# Patient Record
Sex: Male | Born: 1955 | Race: Black or African American | Hispanic: No | Smoking: Former smoker
Health system: Southern US, Community
[De-identification: ages and names within clinical notes are randomized; demographics above are authoritative.]

## PROBLEM LIST (undated history)

## (undated) DIAGNOSIS — M1990 Unspecified osteoarthritis, unspecified site: Secondary | ICD-10-CM

## (undated) DIAGNOSIS — E119 Type 2 diabetes mellitus without complications: Secondary | ICD-10-CM

## (undated) DIAGNOSIS — R3911 Hesitancy of micturition: Secondary | ICD-10-CM

## (undated) DIAGNOSIS — E039 Hypothyroidism, unspecified: Secondary | ICD-10-CM

## (undated) DIAGNOSIS — N400 Enlarged prostate without lower urinary tract symptoms: Secondary | ICD-10-CM

## (undated) HISTORY — PX: OTHER SURGICAL HISTORY: SHX169

## (undated) HISTORY — PX: JOINT REPLACEMENT: SHX530

---

## 2008-02-03 ENCOUNTER — Inpatient Hospital Stay (HOSPITAL_COMMUNITY): Admission: RE | Admit: 2008-02-03 | Discharge: 2008-02-07 | Payer: Self-pay | Admitting: Orthopedic Surgery

## 2008-02-16 ENCOUNTER — Emergency Department (HOSPITAL_COMMUNITY): Admission: EM | Admit: 2008-02-16 | Discharge: 2008-02-16 | Payer: Self-pay | Admitting: Emergency Medicine

## 2008-04-11 ENCOUNTER — Encounter (HOSPITAL_COMMUNITY): Admission: RE | Admit: 2008-04-11 | Discharge: 2008-05-11 | Payer: Self-pay | Admitting: Orthopedic Surgery

## 2009-05-11 ENCOUNTER — Observation Stay (HOSPITAL_COMMUNITY): Admission: EM | Admit: 2009-05-11 | Discharge: 2009-05-12 | Payer: Self-pay | Admitting: Emergency Medicine

## 2010-02-06 ENCOUNTER — Inpatient Hospital Stay (HOSPITAL_COMMUNITY): Admission: EM | Admit: 2010-02-06 | Discharge: 2010-02-09 | Payer: Self-pay | Admitting: Emergency Medicine

## 2010-02-06 ENCOUNTER — Encounter: Payer: Self-pay | Admitting: Emergency Medicine

## 2010-06-11 LAB — DIFFERENTIAL
Basophils Relative: 0 % (ref 0–1)
Basophils Relative: 0 % (ref 0–1)
Eosinophils Absolute: 0.1 10*3/uL (ref 0.0–0.7)
Eosinophils Relative: 1 % (ref 0–5)
Eosinophils Relative: 0 % (ref 0–5)
Lymphocytes Relative: 12 % (ref 12–46)
Monocytes Absolute: 1.2 10*3/uL — ABNORMAL HIGH (ref 0.1–1.0)
Monocytes Relative: 10 % (ref 3–12)
Neutro Abs: 10 10*3/uL — ABNORMAL HIGH (ref 1.7–7.7)
Neutrophils Relative %: 81 % — ABNORMAL HIGH (ref 43–77)

## 2010-06-11 LAB — CBC
MCH: 27.4 pg (ref 26.0–34.0)
MCHC: 32.9 g/dL (ref 30.0–36.0)
MCV: 83.2 fL (ref 78.0–100.0)
MCV: 82.7 fL (ref 78.0–100.0)
Platelets: 226 10*3/uL (ref 150–400)
Platelets: 217 10*3/uL (ref 150–400)
RBC: 3.88 MIL/uL — ABNORMAL LOW (ref 4.22–5.81)
RDW: 14.4 % (ref 11.5–15.5)
WBC: 12.8 10*3/uL — ABNORMAL HIGH (ref 4.0–10.5)

## 2010-06-11 LAB — CULTURE, ROUTINE-ABSCESS: Gram Stain: NONE SEEN

## 2010-06-11 LAB — BASIC METABOLIC PANEL
BUN: 14 mg/dL (ref 6–23)
CO2: 26 mEq/L (ref 19–32)
Calcium: 9.3 mg/dL (ref 8.4–10.5)
Chloride: 103 mEq/L (ref 96–112)
Creatinine, Ser: 0.96 mg/dL (ref 0.4–1.5)
Glucose, Bld: 107 mg/dL — ABNORMAL HIGH (ref 70–99)

## 2010-06-11 LAB — COMPREHENSIVE METABOLIC PANEL
AST: 27 U/L (ref 0–37)
Albumin: 2.9 g/dL — ABNORMAL LOW (ref 3.5–5.2)
Alkaline Phosphatase: 47 U/L (ref 39–117)
BUN: 9 mg/dL (ref 6–23)
GFR calc Af Amer: >60 mL/min (ref >60)
Potassium: 3.5 mEq/L (ref 3.5–5.1)
Total Protein: 7.1 g/dL (ref 6.0–8.3)

## 2010-06-11 LAB — ANAEROBIC CULTURE

## 2010-06-11 LAB — MRSA PCR SCREENING: MRSA by PCR: POSITIVE — AB

## 2010-06-20 LAB — CBC
HCT: 40 % (ref 39.0–52.0)
MCV: 87.7 fL (ref 78.0–100.0)
Platelets: 199 10*3/uL (ref 150–400)
RDW: 13.5 % (ref 11.5–15.5)

## 2010-06-20 LAB — BASIC METABOLIC PANEL
BUN: 15 mg/dL (ref 6–23)
Chloride: 104 mEq/L (ref 96–112)
Glucose, Bld: 114 mg/dL — ABNORMAL HIGH (ref 70–99)
Potassium: 3.9 mEq/L (ref 3.5–5.1)

## 2010-06-20 LAB — DIFFERENTIAL
Basophils Absolute: 0 10*3/uL (ref 0.0–0.1)
Basophils Relative: 0 % (ref 0–1)
Eosinophils Absolute: 0.2 10*3/uL (ref 0.0–0.7)
Eosinophils Relative: 2 % (ref 0–5)

## 2010-07-21 IMAGING — CR DG FINGER THUMB 2+V*L*
3 series · 3 of 3 positions shown · non-contrast
Comparison: None.

CLINICAL DATA: Smashed film at work

LEFT THUMB 2+V

[x finger pa left]
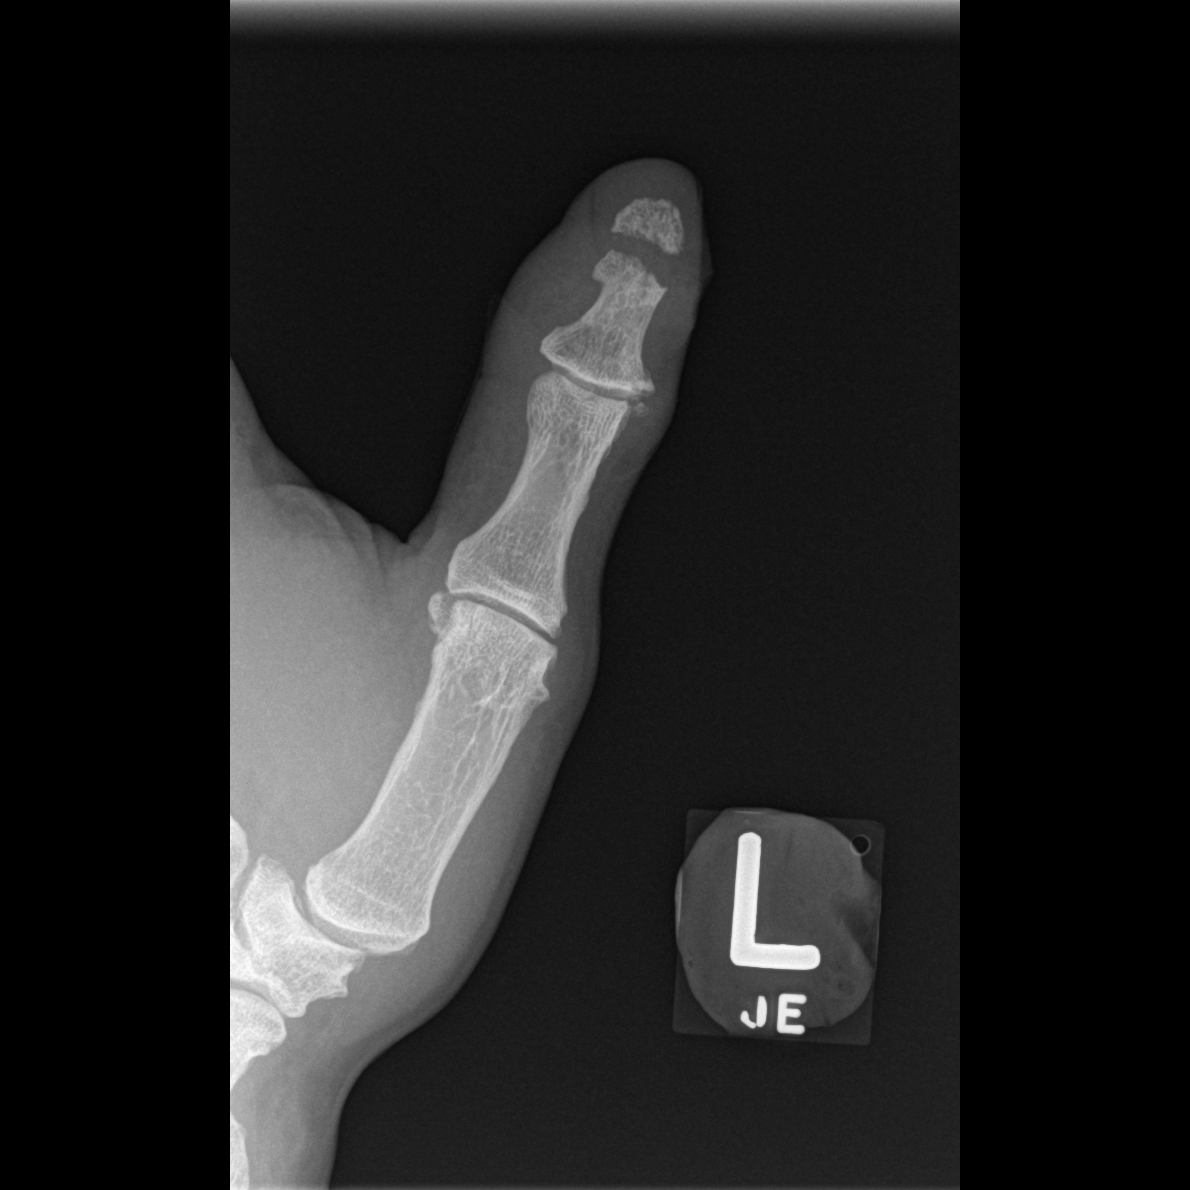

[x finger obl. left]
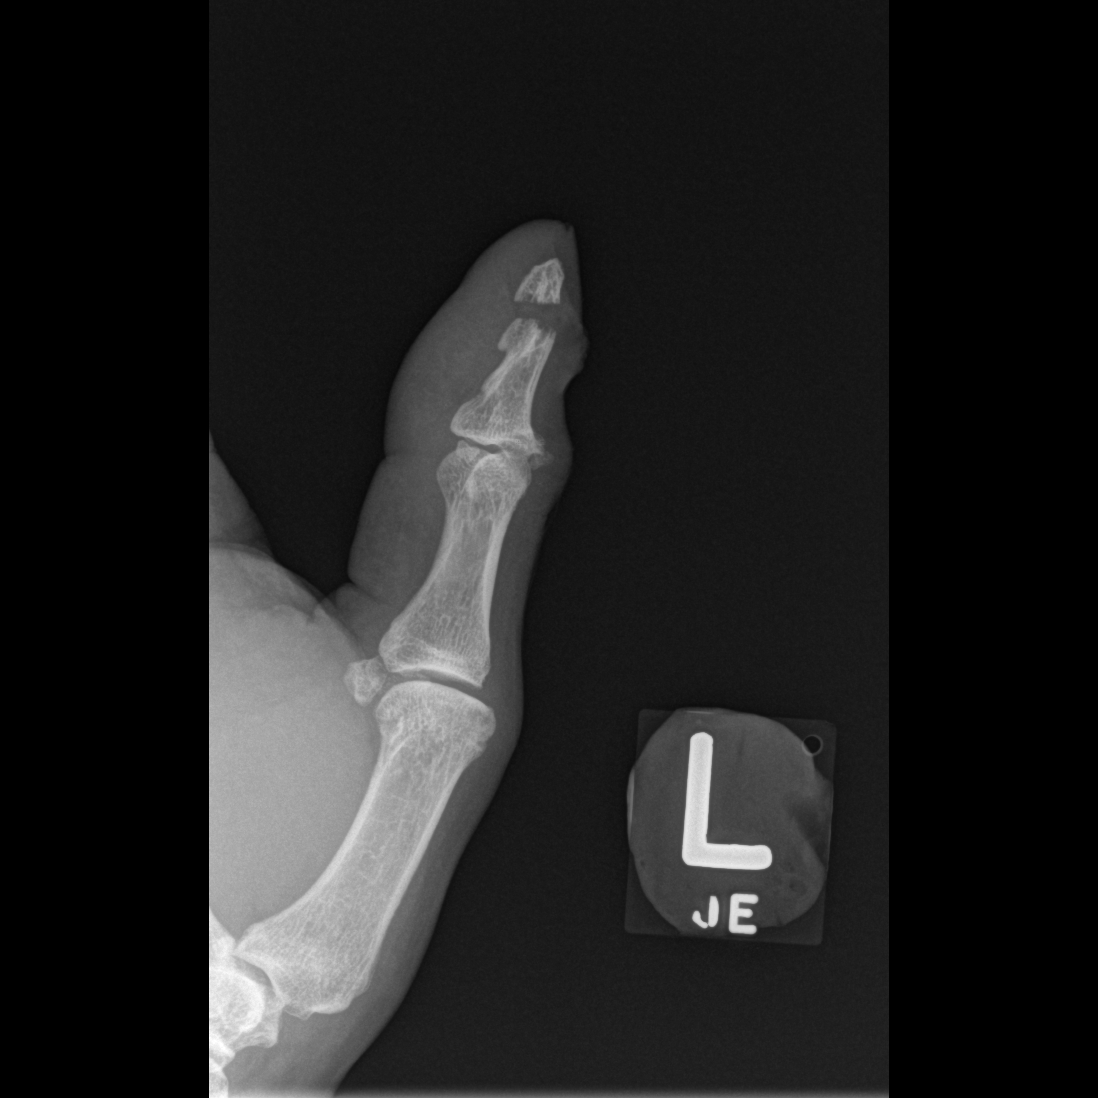

[x finger lateral left]
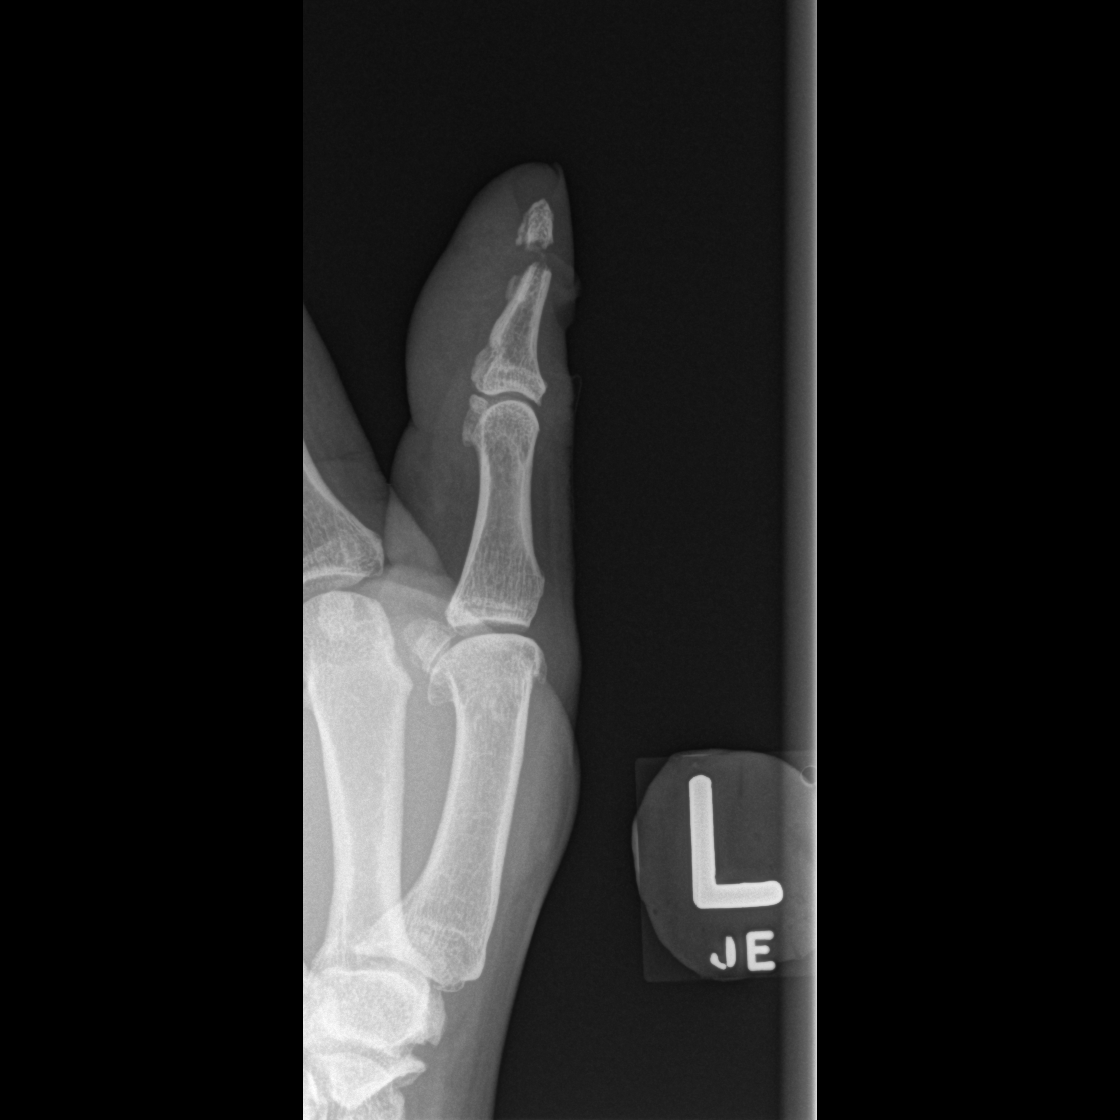

[3 of 3 positions shown; findings below may reference images not displayed]

FINDINGS: There is fracture through the tuft of the distal phalanx
of the left thumb with distraction of fracture fragments by
approximately 3 mm.  No other acute abnormality is seen.  There is
degenerative change at the left first MTP joint.
IMPRESSION: Distracted fracture of the tuft of the distal phalanx.

## 2010-08-13 NOTE — H&P (Signed)
NAME:  [name] NO.:  [id number]   MEDICAL RECORD NO.:  [id number]          PATIENT TYPE:  INP   LOCATION:                               FACILITY:  [facility]   PHYSICIAN:  Georges Lynch. Gioffre, M.D.DATE OF BIRTH:  1955/09/08   DATE OF ADMISSION:  02/03/2008  DATE OF DISCHARGE:                              HISTORY & PHYSICAL   CHIEF COMPLAINT:  Painful loss of range of motion left hip.   HISTORY OF PRESENT ILLNESS:  Patient is a 55 year old gentleman with  progressively worsening pain in his left hip.  He has noted significant  loss of range of motion.  Back in the 1980s, he fell off a tank and  injured it.  He has progressive problems in that hip.  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  Patient has elected to proceed with a left total hip  arthroplasty.   PR[facility] [place] [place]  [place], [place], or [place], [place].   ALLERGIES:  PENICILLIN.   CURRENT MEDICATIONS:  Prostate medications.  Vicodin.  OTC allergy  medicines.  Patient was instructed to bring his medicines to the  hospital for identification.   PAST MEDICAL HISTORY:  1. Benign prostatic hypertrophy.  2. Seasonal allergies.   REVIEW OF SYSTEMS:  Negative for any neurologic issues.  He denies any  pulmonary or cardiovascular.  GI and GU is unremarkable other than his  BPH which is fairly well controlled with his current oral medications.   PAST SURGICAL HISTORY:  He denies any previous surgical procedures.   FAMILY MEDICAL HISTORY:  Father is deceased from complications from  kidney failure and alcohol abuse.  Mother is alive, good medical  history, at age 71.   SOCIAL HISTORY:  Patient is a 55 year old African American male.  He is  divorced.  He is currently unemployed as a Location manager.  He has  smoked in the past, last being 33.  He has occasional alcoholic  beverage.  He has 3 grown children.  He lives alone in a 1-story house.   PHYSICAL EXAMINATION:  VITAL SIGNS:  Height 6 feet 1, weight 230 pounds,  blood pressure 142/82, pulse 74 and regular, respirations 12.  Patient  is afebrile.  GENERAL APPEARANCE:  This is a healthy-appearing gentleman, conscious,  alert and appropriate.  Walks with a significant left-sided antalgic  limp.  HEENT:  Head was normocephalic.  Pupils are equal, round and reactive.  Gross hearing intact.  NECK:  Supple.  No palpable lymphadenopathy.  Good range of motion.  CHEST:  Lung sounds were clear and equal bilaterally.  No wheezing,  rhonchi or rales.  HEART:  Regular rate and rhythm.  No murmurs, rubs, or gallops.  ABDOMEN:  Bowel sounds present.  EXTREMITIES:  Upper extremities had excellent range of motion.  Good  motor strength.  Lower extremities:  Left hip lacked about 5 degrees of  extension.  He was only able to flex up to about 80 degrees.  He had no  internal or external rotation limited by mechanical and discomfort.  His  right hip had full extension.  He was able to flex it up to 120 degrees.  He had 30 degrees internal and external rotation.  He had good range of  motion of both knees and ankles.  PERIPHERAL VASCULAR:  Carotid pulses were 2+, no bruits.  Radial pulses  were 2+.  Dorsalis pedis pulses were 1+.  NEUROLOGIC:  Patient was conscious, alert and appropriate.  BREASTS/RECTAL/GU:  Exams deferred at this time.   IMPRESSION:  1. 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  2. Benign prostatic hypertrophy.  3. Seasonal allergies.   PLAN:  Patient will undergo all routine labs and tests prior to having a  left total hip arthroplasty by Dr. Darrelyn Hillock at [facility] on  February 03, 2008.      Jamelle Rushing, P.A.    ______________________________  Georges Lynch Darrelyn Hillock, M.D.    RWK/MEDQ  D:  01/25/2008  T:  01/25/2008  Job:  [id number]

## 2010-08-13 NOTE — Op Note (Signed)
NAME:  [name], LOE NO.:  [id number]   MEDICAL RECORD NO.:  [id number]          PATIENT TYPE:  INP   LOCATION:  0005                         FACILITY:  [facility]   PHYSICIAN:  Georges Lynch. Gioffre, M.D.DATE OF BIRTH:  01-Feb-1956   DATE OF PROCEDURE:  02/03/2008  DATE OF DISCHARGE:                               OPERATIVE REPORT   SURGEON:  Georges Lynch. Darrelyn Hillock, M.D.   ASSISTANT:  Madlyn Frankel. Charlann Boxer, M.D. and Jamelle Rushing, P.A.   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   OPERATION:  Left total hip arthroplasty utilizing DePuy system.  I  utilized a size 58 mm Pinnacle cup with one screw.  We utilized a hole  eliminator in the cup.  The insert was a metal insert.  Acetabular cup  to a size 58 mm.  The femoral stem was a size 7 Trilock stem high  offset.  The metal ball was a size 40 +1.5.   PROCEDURE IN DETAIL:  Under general anesthesia with the patient on her  right side, left side up, routine orthopedic prepping and draping of the  left hip was carried out.  The patient had 2 grams of IV Ancef preop.  At this time an incision was made over the posterolateral aspect of the  left hip.  Bleeders identified and cauterized.  The self-retaining  retractors were inserted.  At this time I went down and detached the or  incised the iliotibial band, went through the gluteal muscle by blunt  dissection.  Great care taken not to injure the underlying sciatic  nerve.  At this time the external rotators were partially detached from  the femoral neck as well as the capsule and I did a capsulectomy.  Note  this hip basically was frozen and that is it was solid.  It was very  difficult to dislocate the femoral head.  We finally did that.  He had a  large posterior osteophyte that we removed.  We removed multiple  osteophytes from his acetabulum.  At that time we amputated the femoral  head at the appropriate level.  We inserted the box chisel and then the  widening reamer and went down into the shaft with the canal finder first  and then rasped the femoral canal for a size 7 Trilock stem which fit  quite nicely.  Following that we then prepared the acetabulum, reamed  the acetabulum up to a size 57 for a 58 mm cup.  We had good bleeding  cancellous bone.  We then inserted our permanent cup with one screw.  It  was a Pinnacle cup size 58.  After this we inserted our Trilock stem.  Note we did this after multiple trials for leg lengths and position  purposes.  We finally inserted the permanent Trilock stem size 7 with a  +1C tapered head and a size 40 mm ball.  Note, as I said we went through  trials to make sure we had the appropriate length and the appropriate  stability before selecting the final product.  We thoroughly irrigated  out the acetabulum, reduced the permanent hip that we inserted.  We took  the hip through motion.  We had excellent stability.  Following that we  irrigated out the wound.  We inserted a Hemovac drain and closed the  wound layers in  the usual fashion.  Note, one thing I would like to mention earlier in  the procedure we removed multiple osteophytes from the acetabulum.  After the wound was closed a sterile dressing was applied.  Estimated  blood loss was about 300 mL.           ______________________________  Georges Lynch. Darrelyn Hillock, M.D.     RAG/MEDQ  D:  02/03/2008  T:  02/03/2008  Job:  [id number]   cc:   Madlyn Frankel Charlann Boxer, M.D.  Fax: [phone]

## 2010-08-16 NOTE — Discharge Summary (Signed)
NAME:  [name], [name] NO.:  [id number]   MEDICAL RECORD NO.:  [id number]          PATIENT TYPE:  INP   LOCATION:  1606                         FACILITY:  [facility]   PHYSICIAN:  Georges Lynch. Gioffre, M.D.DATE OF BIRTH:  05/02/1955   DATE OF ADMISSION:  02/03/2008  DATE OF DISCHARGE:  02/07/2008                               DISCHARGE SUMMARY   ADMISSION DIAGNOSES:  1. 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  2. Benign prostatic hypertrophy.  3. Seasonal allergies.   DISCHARGE DIAGNOSES:  1. Left total hip arthroplasty.  2. Postoperative blood loss anemia, asymptomatic, allowed to self      correct with p.o. supplements.  3. Postoperative urinary retention with history of benign prostatic      hypertrophy.  Foley catheter placed and follow-up with Urology      outpatient.  4. Seasonal allergies.   HISTORY OF PRESENT ILLNESS:  The patient is a 55 year old gentleman with  history of painful loss of range of motion of his left hip.  Evaluation  showed he had significant AVN with femoral head collapse deformity.  The  patient does have a history of a fall back in the 1980s.  The patient  has elected to proceed with a total hip arthroplasty.   ALLERGIES:  PENICILLIN.   MEDICATIONS ON ADMISSION:  1. Vicodin.  2. OTC allergy medicines.  3. Diclofenac 75 mg a day.  4. Terazosin 2 mg a day.  5. __________ nasal spray.   SURGICAL PROCEDURE:  On February 03, 2008 the patient was taken to the OR  by Dr. Ranee Gosselin, assisted by Oneida Alar PA-C and Dr. Lajoyce Corners.  Under general anesthesia the patient underwent a left total hip  arthroplasty with a DePuy Tri-Lock system.  The patient tolerated the  procedure well.  The femoral head was sent to pathology.  Estimated  blood loss was 300 mL.  No complications.  The patient was sent to the  floor for total hip protocol.  The patient had the following components  implanted:  A size 7 high offset Tri-Lock stem; a  size 58 acetabular  pinnacle sector two cup; a size 40 inside diameter, 58 outside diameter  metal pinnacle insert; a size 40 plus 1.5 femoral bowl; a size 6.5 x 40  cancellous screw; and an apex hole eliminator.   CONSULTS:  The following routine consults were requested: Physical  therapy, case management, pharmacy.   HOSPITAL COURSE:  On February 03, 2008 the patient was admitted to [facility] under the care of Dr. Worthy Rancher.  The patient was taken  to the OR where a left total hip arthroplasty was performed.  The  patient was transferred to the recovery room and then to the orthopedic  floor in good condition with IV pain medicines, antibiotics and Coumadin  for DVT prophylaxis.  The patient did develop some postoperative blood  loss anemia, his H and H dropped to 8 and 23.7 but the patient's vital  signs remained stable.  The patient tolerated it well with physical  therapy so he was allowed to  self correct with just p.o. supplements.  The patient also developed some urinary retention.  Initially he was in-  and-out cathed but ultimate decision was to leave the cath in on  discharge and have him follow up with his urologist as an outpatient.  The patient was also placed on Flomax for this urinary retention.  The  patient's wound remained benign for any signs of infection.  His leg  remained neuromotor and vascularly intact.  The patient progressed  nicely with physical therapy and was able to ambulate 110 feet twice a  day prior to discharge and was able to follow total hip protocol.  The  patient was discharged to home in good condition to follow routine  outpatient therapy instructions.   LABS:  CBC on admission found WBCs 5.4, hemoglobin 14.3, hematocrit 42.6  with platelets 177.  On discharge his H and H was 8.5/24.4.  INR on  discharge was 1.9.  Routine chemistries were within normal limits during  his hospitalization except for his glucose was very labile between  130  to 170s, no treatment was provided.  He also had slightly elevated AST  and ALTs and ALPs on discharge, his AST was 50, ALT was 81, his ALP was  31 and this would be followed up as an outpatient to his primary care  physician.   DISCHARGE INSTRUCTIONS:  1. Diet:  No restrictions.  2. Activity:  The patient is to be partial weightbearing 50% with the      use of a walker as instructed by therapy.  3. Wound care:  The patient is to change his dressing daily.  4. Followup:  The patient needs a follow-up appointment with Dr.      Darrelyn Hillock 2 weeks from date of discharge.  5. The patient needs a followup appointment with his Urologist as      discussed with Dr. Darrelyn Hillock.  6. Home health through [place] [facility] [place] for physical therapy and RN for      Coumadin monitoring.   MEDICATIONS:  1. Percocet 10/650 one tablet every 4 - 6 hours for pain if needed.  2. Robaxin 500 mg once every 6 hours for muscle spasms if needed.  3. Diclofenac on hold until done with Coumadin.  4. Terazosin 2 mg four tablets at bedtime.  5. Loratadine 10 mg in the a.m. if needed.  6. __________ nasal spray as needed.  7. Cipro 1 tablet twice a day until gone.   CONDITION UPON DISCHARGE TO HOME:  Good and improved.      Jamelle Rushing, P.A.    ______________________________  Georges Lynch Darrelyn Hillock, M.D.    RWK/MEDQ  D:  03/01/2008  T:  03/01/2008  Job:  [id number]

## 2010-12-31 LAB — BASIC METABOLIC PANEL
BUN: 10
CO2: 31
CO2: 32
Calcium: 8.4
Chloride: 100
Chloride: 101
Chloride: 103
Creatinine, Ser: 1.1
GFR calc Af Amer: >60
GFR calc Af Amer: >60
GFR calc non Af Amer: >60
Potassium: 3.5
Potassium: 3.6
Sodium: 138
Sodium: 138

## 2010-12-31 LAB — DIFFERENTIAL
Basophils Relative: 1
Eosinophils Absolute: 0.2
Eosinophils Relative: 4
Monocytes Absolute: 0.5
Monocytes Relative: 9
Neutrophils Relative %: 56

## 2010-12-31 LAB — URINALYSIS, ROUTINE W REFLEX MICROSCOPIC
Glucose, UA: NEGATIVE
Hgb urine dipstick: NEGATIVE
Protein, ur: NEGATIVE

## 2010-12-31 LAB — COMPREHENSIVE METABOLIC PANEL
ALT: 109 — ABNORMAL HIGH
ALT: 81 — ABNORMAL HIGH
Albumin: 4.4
Alkaline Phosphatase: 31 — ABNORMAL LOW
Alkaline Phosphatase: 35 — ABNORMAL LOW
CO2: 31
Chloride: 101
GFR calc non Af Amer: >60
Glucose, Bld: 141 — ABNORMAL HIGH
Glucose, Bld: 173 — ABNORMAL HIGH
Potassium: 3.5
Potassium: 4.6
Sodium: 137
Sodium: 138
Total Protein: 8.1

## 2010-12-31 LAB — HEMOGLOBIN AND HEMATOCRIT, BLOOD
HCT: 23.7 — ABNORMAL LOW
HCT: 24.2 — ABNORMAL LOW
HCT: 24.4 — ABNORMAL LOW
HCT: 27.5 — ABNORMAL LOW
Hemoglobin: 12.1 — ABNORMAL LOW
Hemoglobin: 8 — ABNORMAL LOW
Hemoglobin: 8.5 — ABNORMAL LOW
Hemoglobin: 8.5 — ABNORMAL LOW

## 2010-12-31 LAB — URINE CULTURE: Culture: NO GROWTH

## 2010-12-31 LAB — CBC
Hemoglobin: 14.3
RBC: 4.83
RDW: 13.7

## 2010-12-31 LAB — TYPE AND SCREEN
ABO/RH(D): A POS
Antibody Screen: NEGATIVE

## 2010-12-31 LAB — PROTIME-INR
INR: 1.6 — ABNORMAL HIGH
INR: 1.9 — ABNORMAL HIGH
Prothrombin Time: 23.1 — ABNORMAL HIGH

## 2011-01-01 LAB — URINALYSIS, ROUTINE W REFLEX MICROSCOPIC
Ketones, ur: NEGATIVE
Nitrite: NEGATIVE
Protein, ur: 100 — AB
Urobilinogen, UA: 1
pH: 5

## 2011-01-01 LAB — URINE MICROSCOPIC-ADD ON

## 2011-04-18 IMAGING — CR DG CHEST 1V PORT
1 series · 1 of 1 positions shown · non-contrast
Comparison: None.

CLINICAL DATA: Left hand swelling and infection.

PORTABLE CHEST - 1 VIEW

[view not recorded]
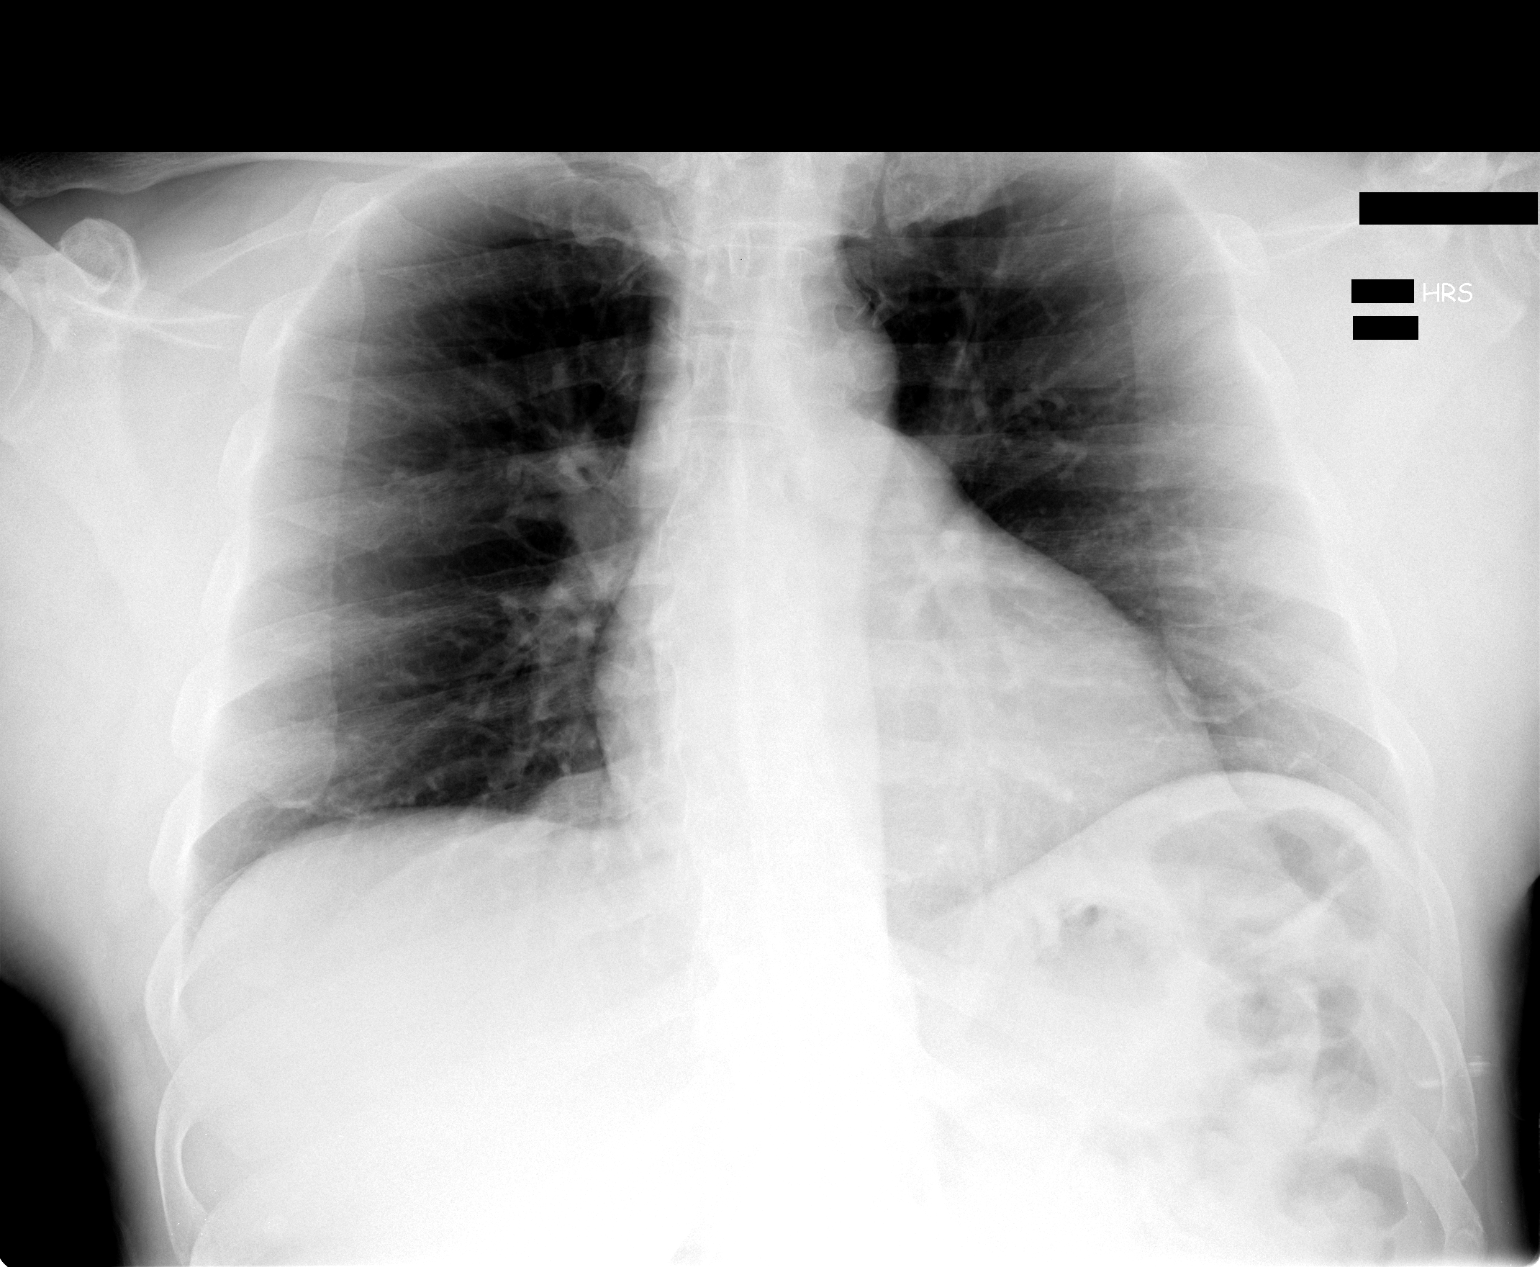

[1 of 1 positions shown; findings below may reference images not displayed]

FINDINGS: Cardiopericardial silhouette and mediastinal contours
appear within normal limits.  Apical lordotic projection.  No
airspace disease.  No effusion.  Trachea midline.
IMPRESSION: No active cardiopulmonary disease.

## 2014-01-30 ENCOUNTER — Encounter (HOSPITAL_COMMUNITY): Payer: Self-pay | Admitting: *Deleted

## 2014-01-30 ENCOUNTER — Emergency Department (HOSPITAL_COMMUNITY)
Admission: EM | Admit: 2014-01-30 | Discharge: 2014-01-30 | Disposition: A | Discharge status: Home / Self Care | Payer: Non-veteran care | Attending: Emergency Medicine | Admitting: Emergency Medicine

## 2014-01-30 DIAGNOSIS — Z880 Allergy status to penicillin: Secondary | ICD-10-CM | POA: Insufficient documentation

## 2014-01-30 DIAGNOSIS — B350 Tinea barbae and tinea capitis: Secondary | ICD-10-CM | POA: Insufficient documentation

## 2014-01-30 DIAGNOSIS — Z79899 Other long term (current) drug therapy: Secondary | ICD-10-CM | POA: Insufficient documentation

## 2014-01-30 MED ORDER — GRISEOFULVIN ULTRAMICROSIZE 250 MG PO TABS: 250.0000 mg | ORAL_TABLET | Freq: Every day | ORAL | Status: DC

## 2014-01-30 NOTE — ED Notes (Signed)
Itching rash, for 2-3 weeks, [facility], but no better.

## 2014-01-30 NOTE — Discharge Instructions (Signed)
Ringworm of the Scalp Tinea Capitis is also called scalp ringworm. It is a fungal infection of the skin on the scalp seen mainly in children.  CAUSES  Scalp ringworm spreads from:  Other people.  Pets (cats and dogs) and animals.  Bedding, hats, combs or brushes shared with an infected person  Theater seats that an infected person sat in. SYMPTOMS  Scalp ringworm causes the following symptoms:  Flaky scales that look like dandruff.  Circles of thick, raised red skin.  Hair loss.  Red pimples or pustules.  Swollen glands in the back of the neck.  Itching. DIAGNOSIS  A skin scraping or infected hairs will be sent to test for fungus. Testing can be done either by looking under the microscope (KOH examination) or by doing a culture (test to try to grow the fungus). A culture can take up to 2 weeks to come back. TREATMENT   Scalp ringworm must be treated with medicine by mouth to kill the fungus for 6 to 8 weeks.  Medicated shampoos (ketoconazole or selenium sulfide shampoo) may be used to decrease the shedding of fungal spores from the scalp.  Steroid medicines are used for severe cases that are very inflamed in conjunction with antifungal medication.  It is important that any family members or pets that have the fungus be treated. HOME CARE INSTRUCTIONS   Be sure to treat the rash completely - follow your caregiver's instructions. It can take a month or more to treat. If you do not treat it long enough, the rash can come back.  Watch for other cases in your family or pets.  Do not share brushes, combs, barrettes, or hats. Do not share towels.  Combs, brushes, and hats should be cleaned carefully and natural bristle brushes must be thrown away.  It is not necessary to shave the scalp or wear a hat during treatment.  Children may attend school once they start treatment with the oral medicine.  Be sure to follow up with your caregiver as directed to be sure the infection  is gone. SEEK MEDICAL CARE IF:   Rash is worse.  Rash is spreading.  Rash returns after treatment is completed.  The rash is not better in 2 weeks with treatment. Fungal infections are slow to respond to treatment. Some redness may remain for several weeks after the fungus is gone. SEEK IMMEDIATE MEDICAL CARE IF:  The area becomes red, warm, tender, and swollen.  Pus is oozing from the rash.  You or your child has an oral temperature above 102 F (38.9 C), not controlled by medicine. Document Released: 03/14/2000 Document Revised: 06/09/2011 Document Reviewed: 04/26/2008 ExitCare Patient Information 2015 [employer]. This information is not intended to replace advice given to you by your health care provider. Make sure you discuss any questions you have with your health care provider.  

## 2014-01-30 NOTE — ED Provider Notes (Signed)
CSN: [id number]     Arrival date & time 01/30/14  1821 History   First MD Initiated Contact with Patient 01/30/14 2031     Chief Complaint  Patient presents with  . Rash     (Consider location/radiation/quality/duration/timing/severity/associated sxs/prior Treatment) Patient is a 58 y.o. male presenting with rash. The history is provided by the patient. No language interpreter was used.  Rash Location:  Full body Quality: itchiness, redness and scaling   Severity:  Severe Onset quality:  Gradual Duration:  2 weeks Timing:  Constant Progression:  Worsening Chronicity:  New Relieved by:  Nothing Worsened by:  Nothing tried Ineffective treatments:  None tried   History reviewed. No pertinent past medical history. Past Surgical History  Procedure Laterality Date  . Joint replacement     History reviewed. No pertinent family history. History  Substance Use Topics  . Smoking status: Never Smoker   . Smokeless tobacco: Not on file  . Alcohol Use: Yes    Review of Systems  Skin: Positive for rash.  All other systems reviewed and are negative.     Allergies  Penicillins  Home Medications   Prior to Admission medications   Medication Sig Start Date End Date Taking? Authorizing Provider  CODEINE SULFATE PO Take 1 tablet by mouth 2 (two) times daily as needed (for pain).   Yes Historical Provider, MD  levothyroxine (SYNTHROID, LEVOTHROID) 50 MCG tablet Take 50 mcg by mouth daily before breakfast.   Yes Historical Provider, MD  tamsulosin (FLOMAX) 0.4 MG CAPS capsule Take 0.4 mg by mouth at bedtime.   Yes Historical Provider, MD  griseofulvin (GRIS-PEG) 250 MG tablet Take 1 tablet (250 mg total) by mouth daily. 01/30/14   Lonia SkinnerLeslie K Sofia, PA-C   BP 154/92 mmHg  Pulse 84  Temp(Src) 98.2 F (36.8 C) (Oral)  Resp 18  Ht 6\' 1"  (1.854 m)  Wt 270 lb (122.471 kg)  BMI 35.63 kg/m2  SpO2 100% Physical Exam  Constitutional: He is oriented to person, place, and time. He  appears well-developed and well-nourished.  HENT:  Head: Normocephalic.  Multiple round scaly areas scalp,  Scattered round itchy red areas trunk arms and legs  Eyes: Conjunctivae and EOM are normal. Pupils are equal, round, and reactive to light.  Neck: Normal range of motion.  Pulmonary/Chest: Effort normal.  Abdominal: He exhibits no distension.  Musculoskeletal: Normal range of motion.  Neurological: He is alert and oriented to person, place, and time.  Psychiatric: He has a normal mood and affect.  Nursing note and vitals reviewed.   ED Course  Procedures (including critical care time) Labs Review Labs Reviewed - No data to display  Imaging Review No results found.   EKG Interpretation None      MDM   Final diagnoses:  Tinea capitis    Griseofulvin Pt advised to call his Md and schedule recheck within 2 weeks.  Pt advised of possible liver problems and need for monitoring.     Lonia SkinnerLeslie K RockwoodSofia, PA-C 01/30/14 2336

## 2015-04-06 ENCOUNTER — Ambulatory Visit: Payer: Self-pay | Admitting: Orthopedic Surgery

## 2015-04-11 ENCOUNTER — Ambulatory Visit: Payer: Self-pay | Admitting: Orthopedic Surgery

## 2015-04-11 NOTE — H&P (Signed)
TOTAL HIP ADMISSION H&P  Patient is admitted for right total hip arthroplasty.  Subjective:  Chief Complaint: right hip pain  HPI: [name], 60 y.o. 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  Onset of symptoms was gradual starting >10 years ago with gradually worsening course since that time.The patient noted prior procedures of the hip to include arthroplasty on the left hip(s).  Patient currently rates pain in the right hip at 10 out of 10 with activity. Patient has night pain, worsening of pain with activity and weight bearing, pain that interfers with activities of daily living and pain with passive range of motion. Patient has evidence of subchondral cysts, subchondral sclerosis, periarticular osteophytes and joint space narrowing by imaging studies. This condition presents safety issues increasing the risk of falls. There is no current active infection.  There are no active problems to display for this patient.  No past medical history on file.  DM II Past Surgical History  Procedure Laterality Date  . Joint replacement       (Not in a hospital admission) Allergies  Allergen Reactions  . Penicillins Other (See Comments)    Hallucinations, Syncope    Social History  Substance Use Topics  . Smoking status: Never Smoker   . Smokeless tobacco: Not on file  . Alcohol Use: Yes    No family history on file.   Review of Systems  Constitutional: Negative.   HENT: Negative.   Eyes: Negative.   Respiratory: Negative.   Cardiovascular: Negative.   Gastrointestinal: Negative.   Genitourinary: Negative.   Musculoskeletal: Positive for joint pain.  Skin: Negative.   Neurological:  Negative.   Endo/Heme/Allergies: Negative.   Psychiatric/Behavioral: Negative.     Objective:  Physical Exam  Vitals reviewed. Constitutional: He is oriented to person, place, and time. He appears well-developed.  HENT:  Head: Normocephalic and atraumatic.  Eyes: Conjunctivae and EOM are normal. Pupils are equal, round, and reactive to light.  Neck: Normal range of motion. Neck supple.  Cardiovascular: Normal rate, regular rhythm, normal heart sounds and intact distal pulses.   Respiratory: Breath sounds normal. No respiratory distress.  GI: Soft. Bowel sounds are normal. He exhibits no distension.  Genitourinary:  deferred  Musculoskeletal:       Right hip: He exhibits decreased range of motion and bony tenderness.  Neurological: He is alert and oriented to person, place, and time. He has normal reflexes.  Skin: Skin is warm and dry.  Psychiatric: He has a normal mood and affect. His behavior is normal. Judgment and thought content normal.    Vital signs in last 24 hours: @  Labs:   Estimated body mass index is 35.63 kg/(m^2) as calculated from the following:   Height as of 01/30/14:  (1.854 m).   Weight as of 01/30/14: 122.471 kg (270 lb).   Imaging Review Plain radiographs demonstrate severe degenerative joint disease of the right hip(s). The bone quality appears to be adequate for age and reported activity level.  Assessment/Plan:  End stage arthritis, right hip(s)  The patient history, physical examination, clinical judgement of the provider and imaging studies are consistent with end stage degenerative joint disease of the right hip(s) and total hip arthroplasty is deemed medically necessary. The treatment  options including medical management, injection therapy, arthroscopy and arthroplasty were discussed at length. The risks and benefits of total hip arthroplasty were presented and reviewed. The risks due to aseptic loosening, infection, stiffness,  dislocation/subluxation,  thromboembolic complications and other imponderables were discussed.  The patient acknowledged the explanation, agreed to proceed with the plan and consent was signed. Patient is being admitted for inpatient treatment for surgery, pain control, PT, OT, prophylactic antibiotics, VTE prophylaxis, progressive ambulation and ADL's and discharge planning.The patient is planning to be discharged home with home health services

## 2015-04-19 ENCOUNTER — Encounter (HOSPITAL_COMMUNITY)
Admission: RE | Admit: 2015-04-19 | Discharge: 2015-04-19 | Disposition: A | Discharge status: Home / Self Care | Payer: No Typology Code available for payment source | Source: Ambulatory Visit | Attending: Orthopedic Surgery | Admitting: Orthopedic Surgery

## 2015-04-19 ENCOUNTER — Encounter (HOSPITAL_COMMUNITY): Payer: Self-pay

## 2015-04-19 DIAGNOSIS — Z0183 Encounter for blood typing: Secondary | ICD-10-CM | POA: Diagnosis not present

## 2015-04-19 DIAGNOSIS — M879 Osteonecrosis, unspecified: Secondary | ICD-10-CM | POA: Diagnosis not present

## 2015-04-19 DIAGNOSIS — Z01812 Encounter for preprocedural laboratory examination: Secondary | ICD-10-CM | POA: Diagnosis not present

## 2015-04-19 HISTORY — DX: Unspecified osteoarthritis, unspecified site: M19.90

## 2015-04-19 HISTORY — DX: Hypothyroidism, unspecified: E03.9

## 2015-04-19 HISTORY — DX: Benign prostatic hyperplasia without lower urinary tract symptoms: N40.0

## 2015-04-19 HISTORY — DX: Type 2 diabetes mellitus without complications: E11.9

## 2015-04-19 HISTORY — DX: Hesitancy of micturition: R39.11

## 2015-04-19 LAB — COMPREHENSIVE METABOLIC PANEL
ALT: 20 U/L (ref 17–63)
ANION GAP: 10 (ref 5–15)
AST: 19 U/L (ref 15–41)
Albumin: 4.4 g/dL (ref 3.5–5.0)
Alkaline Phosphatase: 35 U/L — ABNORMAL LOW (ref 38–126)
BILIRUBIN TOTAL: 0.8 mg/dL (ref 0.3–1.2)
BUN: 12 mg/dL (ref 6–20)
CHLORIDE: 102 mmol/L (ref 101–111)
CO2: 28 mmol/L (ref 22–32)
Calcium: 9.6 mg/dL (ref 8.9–10.3)
Creatinine, Ser: 1.03 mg/dL (ref 0.61–1.24)
Glucose, Bld: 95 mg/dL (ref 65–99)
POTASSIUM: 4.4 mmol/L (ref 3.5–5.1)
Sodium: 140 mmol/L (ref 135–145)
TOTAL PROTEIN: 8.1 g/dL (ref 6.5–8.1)

## 2015-04-19 LAB — CBC
HEMATOCRIT: 43 % (ref 39.0–52.0)
Hemoglobin: 14 g/dL (ref 13.0–17.0)
MCH: 28.2 pg (ref 26.0–34.0)
MCHC: 32.6 g/dL (ref 30.0–36.0)
MCV: 86.5 fL (ref 78.0–100.0)
PLATELETS: 217 10*3/uL (ref 150–400)
RBC: 4.97 MIL/uL (ref 4.22–5.81)
RDW: 13.8 % (ref 11.5–15.5)
WBC: 6 10*3/uL (ref 4.0–10.5)

## 2015-04-19 LAB — SURGICAL PCR SCREEN
MRSA, PCR: NEGATIVE
Staphylococcus aureus: NEGATIVE

## 2015-04-19 LAB — URINALYSIS, ROUTINE W REFLEX MICROSCOPIC
BILIRUBIN URINE: NEGATIVE
GLUCOSE, UA: NEGATIVE mg/dL
Hgb urine dipstick: NEGATIVE
KETONES UR: NEGATIVE mg/dL
Leukocytes, UA: NEGATIVE
NITRITE: NEGATIVE
PH: 5.5 (ref 5.0–8.0)
Protein, ur: NEGATIVE mg/dL
SPECIFIC GRAVITY, URINE: 1.011 (ref 1.005–1.030)

## 2015-04-19 LAB — APTT: aPTT: 31 seconds (ref 24–37)

## 2015-04-19 LAB — PROTIME-INR
INR: 1.02 (ref 0.00–1.49)
PROTHROMBIN TIME: 13.6 s (ref 11.6–15.2)

## 2015-04-19 NOTE — Progress Notes (Signed)
A1C results per chart 04/11/2015

## 2015-04-19 NOTE — Progress Notes (Signed)
Clearance note per chart per Dr Paula Compton 04/18/2015

## 2015-04-19 NOTE — Patient Instructions (Signed)
[name]  04/19/2015   Your procedure is scheduled on: Thursday April 26, 2015   Report to [facility] Main  Entrance take [place]  elevators to 3rd floor to  Short Stay Center at 10:45 AM.  Call this number if you have problems the morning of surgery [phone]   Remember: ONLY 1 PERSON MAY GO WITH YOU TO SHORT STAY TO GET  READY MORNING OF YOUR SURGERY.  Do not eat food After Midnight but may take clear liquids till 6:45 am day of surgery then nothing by mouth.     Take these medicines the morning of surgery with A SIP OF WATER: Levothyroxine   DO NOT TAKE ANY DIABETIC MEDICATIONS DAY OF YOUR SURGERY                               You may not have any metal on your body including hair pins and              piercings  Do not wear jewelry,  lotions, powders or colognes, deodorant                           Men may shave face and neck.   Do not bring valuables to the hospital. [facility] IS NOT             RESPONSIBLE   FOR VALUABLES.  Contacts, dentures or bridgework may not be worn into surgery.  Leave suitcase in the car. After surgery it may be brought to your room.                Please read over the following fact sheets you were given:MRSA INFORMATION SHEET; INCENTIVE SPIROMETER; BLOOD TRANSFUSION INFORMATION SHEET  _____________________________________________________________________             [facility] - Preparing for Surgery Before surgery, you can play an important role.  Because skin is not sterile, your skin needs to be as free of germs as possible.  You can reduce the number of germs on your skin by washing with CHG (chlorahexidine gluconate) soap before surgery.  CHG is an antiseptic cleaner which kills germs and bonds with the skin to continue killing germs even after washing. Please DO NOT use if you have an allergy to CHG or antibacterial soaps.  If your skin becomes reddened/irritated stop using the CHG and inform your nurse when  you arrive at Short Stay. Do not shave (including legs and underarms) for at least 48 hours prior to the first CHG shower.  You may shave your face/neck. Please follow these instructions carefully:  1.  Shower with CHG Soap the night before surgery and the  morning of Surgery.  2.  If you choose to wash your hair, wash your hair first as usual with your  normal  shampoo.  3.  After you shampoo, rinse your hair and body thoroughly to remove the  shampoo.                           4.  Use CHG as you would any other liquid soap.  You can apply chg directly  to the skin and wash  Gently with a scrungie or clean washcloth.  5.  Apply the CHG Soap to your body ONLY FROM THE NECK DOWN.   Do not use on face/ open                           Wound or open sores. Avoid contact with eyes, ears mouth and genitals (private parts).                       Wash face,  Genitals (private parts) with your normal soap.             6.  Wash thoroughly, paying special attention to the area where your surgery  will be performed.  7.  Thoroughly rinse your body with warm water from the neck down.  8.  DO NOT shower/wash with your normal soap after using and rinsing off  the CHG Soap.                9.  Pat yourself dry with a clean towel.            10.  Wear clean pajamas.            11.  Place clean sheets on your bed the night of your first shower and do not  sleep with pets. Day of Surgery : Do not apply any lotions/deodorants the morning of surgery.  Please wear clean clothes to the hospital/surgery center.  FAILURE TO FOLLOW THESE INSTRUCTIONS MAY RESULT IN THE CANCELLATION OF YOUR SURGERY PATIENT SIGNATURE_________________________________  NURSE SIGNATURE__________________________________  ________________________________________________________________________    CLEAR LIQUID DIET   Foods Allowed                                                                     Foods  Excluded  Coffee and tea, regular and decaf                             liquids that you cannot  Plain Jell-O in any flavor                                             see through such as: Fruit ices (not with fruit pulp)                                     milk, soups, orange juice  Iced Popsicles                                    All solid food Carbonated beverages, regular and diet                                    Cranberry, grape and apple juices Sports drinks like Gatorade Lightly seasoned clear broth or consume(fat free) Sugar, honey syrup  Sample Menu Breakfast                                Lunch                                     Supper Cranberry juice                    Beef broth                            Chicken broth Jell-O                                     Grape juice                           Apple juice Coffee or tea                        Jell-O                                      Popsicle                                                Coffee or tea                        Coffee or tea  _____________________________________________________________________    Incentive Spirometer  An incentive spirometer is a tool that can help keep your lungs clear and active. This tool measures how well you are filling your lungs with each breath. Taking long deep breaths may help reverse or decrease the chance of developing breathing (pulmonary) problems (especially infection) following:  A long period of time when you are unable to move or be active. BEFORE THE PROCEDURE   If the spirometer includes an indicator to show your best effort, your nurse or respiratory therapist will set it to a desired goal.  If possible, sit up straight or lean slightly forward. Try not to slouch.  Hold the incentive spirometer in an upright position. INSTRUCTIONS FOR USE   Sit on the edge of your bed if possible, or sit up as far as you can in bed or on a chair.  Hold the incentive  spirometer in an upright position.  Breathe out normally.  Place the mouthpiece in your mouth and seal your lips tightly around it.  Breathe in slowly and as deeply as possible, raising the piston or the ball toward the top of the column.  Hold your breath for 3-5 seconds or for as long as possible. Allow the piston or ball to fall to the bottom of the column.  Remove the mouthpiece from your mouth and breathe out normally.  Rest for a few seconds and repeat Steps 1 through 7 at least 10 times every 1-2 hours when you are awake. Take your time and take a few normal breaths between  deep breaths.  The spirometer may include an indicator to show your best effort. Use the indicator as a goal to work toward during each repetition.  After each set of 10 deep breaths, practice coughing to be sure your lungs are clear. If you have an incision (the cut made at the time of surgery), support your incision when coughing by placing a pillow or rolled up towels firmly against it. Once you are able to get out of bed, walk around indoors and cough well. You may stop using the incentive spirometer when instructed by your caregiver.  RISKS AND COMPLICATIONS  Take your time so you do not get dizzy or light-headed.  If you are in pain, you may need to take or ask for pain medication before doing incentive spirometry. It is harder to take a deep breath if you are having pain. AFTER USE  Rest and breathe slowly and easily.  It can be helpful to keep track of a log of your progress. Your caregiver can provide you with a simple table to help with this. If you are using the spirometer at home, follow these instructions: [place] IF:   You are having difficultly using the spirometer.  You have trouble using the spirometer as often as instructed.  Your pain medication is not giving enough relief while using the spirometer.  You develop fever of 100.5 F (38.1 C) or higher. SEEK IMMEDIATE MEDICAL  CARE IF:   You cough up bloody sputum that had not been present before.  You develop fever of 102 F (38.9 C) or greater.  You develop worsening pain at or near the incision site. MAKE SURE YOU:   Understand these instructions.  Will watch your condition.  Will get help right away if you are not doing well or get worse. Document Released: 07/28/2006 Document Revised: 06/09/2011 Document Reviewed: 09/28/2006 Exit[employer] Patient Information 2014 Exit[employer], [place].   ________________________________________________________________________  WHAT IS A BLOOD TRANSFUSION? Blood Transfusion Information  A transfusion is the replacement of blood or some of its parts. Blood is made up of multiple cells which provide different functions.  Red blood cells carry oxygen and are used for blood loss replacement.  White blood cells fight against infection.  Platelets control bleeding.  Plasma helps clot blood.  Other blood products are available for specialized needs, such as hemophilia or other clotting disorders. BEFORE THE TRANSFUSION  Who gives blood for transfusions?   Healthy volunteers who are fully evaluated to make sure their blood is safe. This is blood bank blood. Transfusion therapy is the safest it has ever been in the practice of medicine. Before blood is taken from a donor, a complete history is taken to make sure that person has no history of diseases nor engages in risky social behavior (examples are intravenous drug use or sexual activity with multiple partners). The donor's travel history is screened to minimize risk of transmitting infections, such as malaria. The donated blood is tested for signs of infectious diseases, such as HIV and hepatitis. The blood is then tested to be sure it is compatible with you in order to minimize the chance of a transfusion reaction. If you or a relative donates blood, this is often done in anticipation of surgery and is not appropriate for  emergency situations. It takes many days to process the donated blood. RISKS AND COMPLICATIONS Although transfusion therapy is very safe and saves many lives, the main dangers of transfusion include:   Getting an infectious disease.  Developing a transfusion reaction. This is an allergic reaction to something in the blood you were given. Every precaution is taken to prevent this. The decision to have a blood transfusion has been considered carefully by your caregiver before blood is given. Blood is not given unless the benefits outweigh the risks. AFTER THE TRANSFUSION  Right after receiving a blood transfusion, you will usually feel much better and more energetic. This is especially true if your red blood cells have gotten low (anemic). The transfusion raises the level of the red blood cells which carry oxygen, and this usually causes an energy increase.  The nurse administering the transfusion will monitor you carefully for complications. HOME CARE INSTRUCTIONS  No special instructions are needed after a transfusion. You may find your energy is better. Speak with your caregiver about any limitations on activity for underlying diseases you may have. SEEK MEDICAL CARE IF:   Your condition is not improving after your transfusion.  You develop redness or irritation at the intravenous (IV) site. SEEK IMMEDIATE MEDICAL CARE IF:  Any of the following symptoms occur over the next 12 hours:  Shaking chills.  You have a temperature by mouth above 102 F (38.9 C), not controlled by medicine.  Chest, back, or muscle pain.  People around you feel you are not acting correctly or are confused.  Shortness of breath or difficulty breathing.  Dizziness and fainting.  You get a rash or develop hives.  You have a decrease in urine output.  Your urine turns a dark color or changes to pink, red, or brown. Any of the following symptoms occur over the next 10 days:  You have a temperature by  mouth above 102 F (38.9 C), not controlled by medicine.  Shortness of breath.  Weakness after normal activity.  The white part of the eye turns yellow (jaundice).  You have a decrease in the amount of urine or are urinating less often.  Your urine turns a dark color or changes to pink, red, or brown. Document Released: 03/14/2000 Document Revised: 06/09/2011 Document Reviewed: 11/01/2007 [facility] Patient Information 2014 [place], [place].  _______________________________________________________________________

## 2015-04-19 NOTE — Progress Notes (Signed)
EKG per chart 04/18/2015

## 2015-04-26 ENCOUNTER — Inpatient Hospital Stay (HOSPITAL_COMMUNITY): Payer: No Typology Code available for payment source | Admitting: Certified Registered Nurse Anesthetist

## 2015-04-26 ENCOUNTER — Inpatient Hospital Stay (HOSPITAL_COMMUNITY): Payer: No Typology Code available for payment source

## 2015-04-26 ENCOUNTER — Encounter (HOSPITAL_COMMUNITY): Payer: Self-pay | Admitting: *Deleted

## 2015-04-26 ENCOUNTER — Encounter (HOSPITAL_COMMUNITY)
Admission: RE | Disposition: A | Discharge status: Home Health | Payer: Self-pay | Source: Ambulatory Visit | Attending: Orthopedic Surgery

## 2015-04-26 ENCOUNTER — Inpatient Hospital Stay (HOSPITAL_COMMUNITY)
Admission: RE | Admit: 2015-04-26 | Discharge: 2015-04-27 | DRG: 470 | Disposition: A | Discharge status: Home Health | Payer: No Typology Code available for payment source | Source: Ambulatory Visit | Attending: Orthopedic Surgery | Admitting: Orthopedic Surgery

## 2015-04-26 DIAGNOSIS — M25551 Pain in right hip: Secondary | ICD-10-CM | POA: Diagnosis present

## 2015-04-26 DIAGNOSIS — M1611 Unilateral primary osteoarthritis, right hip: Secondary | ICD-10-CM | POA: Diagnosis present

## 2015-04-26 DIAGNOSIS — Z01812 Encounter for preprocedural laboratory examination: Secondary | ICD-10-CM | POA: Diagnosis not present

## 2015-04-26 DIAGNOSIS — E119 Type 2 diabetes mellitus without complications: Secondary | ICD-10-CM | POA: Diagnosis present

## 2015-04-26 DIAGNOSIS — Z09 Encounter for follow-up examination after completed treatment for conditions other than malignant neoplasm: Secondary | ICD-10-CM

## 2015-04-26 DIAGNOSIS — Z419 Encounter for procedure for purposes other than remedying health state, unspecified: Secondary | ICD-10-CM

## 2015-04-26 HISTORY — PX: TOTAL HIP ARTHROPLASTY: SHX124

## 2015-04-26 LAB — GLUCOSE, CAPILLARY
GLUCOSE-CAPILLARY: 74 mg/dL (ref 65–99)
GLUCOSE-CAPILLARY: 70 mg/dL (ref 65–99)
GLUCOSE-CAPILLARY: 82 mg/dL (ref 65–99)
GLUCOSE-CAPILLARY: 140 mg/dL — AB (ref 65–99)
GLUCOSE-CAPILLARY: 153 mg/dL — AB (ref 65–99)
GLUCOSE-CAPILLARY: 187 mg/dL — AB (ref 65–99)
GLUCOSE-CAPILLARY: 177 mg/dL — AB (ref 65–99)
Glucose-Capillary: 162 mg/dL — ABNORMAL HIGH (ref 65–99)
Glucose-Capillary: 79 mg/dL (ref 65–99)

## 2015-04-26 LAB — TYPE AND SCREEN
ABO/RH(D): A POS
ANTIBODY SCREEN: NEGATIVE

## 2015-04-26 SURGERY — ARTHROPLASTY, HIP, TOTAL, ANTERIOR APPROACH
Anesthesia: General | Site: Hip | Laterality: Right

## 2015-04-26 MED ORDER — HYDROMORPHONE HCL 1 MG/ML IJ SOLN
INTRAMUSCULAR | Status: AC
  Filled 2015-04-26: qty 1

## 2015-04-26 MED ORDER — FENTANYL CITRATE (PF) 100 MCG/2ML IJ SOLN
INTRAMUSCULAR | Status: DC | PRN
  Administered 2015-04-26: 150 ug via INTRAVENOUS
  Administered 2015-04-26 (×2): 50 ug via INTRAVENOUS

## 2015-04-26 MED ORDER — HYDROMORPHONE HCL 2 MG/ML IJ SOLN
INTRAMUSCULAR | Status: AC
  Filled 2015-04-26: qty 1

## 2015-04-26 MED ORDER — SODIUM CHLORIDE 0.9 % IJ SOLN
INTRAMUSCULAR | Status: DC | PRN
  Administered 2015-04-26: 30 mL via INTRAVENOUS

## 2015-04-26 MED ORDER — PHENOL 1.4 % MT LIQD
1.0000 | OROMUCOSAL | Status: DC | PRN
  Filled 2015-04-26: qty 177

## 2015-04-26 MED ORDER — TRANEXAMIC ACID 1000 MG/10ML IV SOLN
1000.0000 mg | INTRAVENOUS | Status: AC
  Administered 2015-04-26: 1000 mg via INTRAVENOUS
  Filled 2015-04-26: qty 10

## 2015-04-26 MED ORDER — METOCLOPRAMIDE HCL 5 MG/ML IJ SOLN
INTRAMUSCULAR | Status: DC | PRN
  Administered 2015-04-26: 10 mg via INTRAVENOUS

## 2015-04-26 MED ORDER — CLINDAMYCIN PHOSPHATE 600 MG/50ML IV SOLN
600.0000 mg | Freq: Four times a day (QID) | INTRAVENOUS | Status: AC
  Administered 2015-04-26 – 2015-04-27 (×2): 600 mg via INTRAVENOUS
  Filled 2015-04-26 (×2): qty 50

## 2015-04-26 MED ORDER — PROPOFOL 10 MG/ML IV BOLUS
INTRAVENOUS | Status: AC
  Filled 2015-04-26: qty 40

## 2015-04-26 MED ORDER — DEXTROSE 50 % IV SOLN
25.0000 mL | Freq: Once | INTRAVENOUS | Status: AC
  Administered 2015-04-26: 25 mL via INTRAVENOUS
  Filled 2015-04-26: qty 50

## 2015-04-26 MED ORDER — SODIUM CHLORIDE 0.9 % IV SOLN: INTRAVENOUS | Status: DC

## 2015-04-26 MED ORDER — MIDAZOLAM HCL 5 MG/5ML IJ SOLN
INTRAMUSCULAR | Status: DC | PRN
  Administered 2015-04-26: 2 mg via INTRAVENOUS

## 2015-04-26 MED ORDER — EPHEDRINE SULFATE 50 MG/ML IJ SOLN
INTRAMUSCULAR | Status: AC
  Filled 2015-04-26: qty 1

## 2015-04-26 MED ORDER — ISOPROPYL ALCOHOL 70 % SOLN
Status: AC
  Filled 2015-04-26: qty 480

## 2015-04-26 MED ORDER — WATER FOR IRRIGATION, STERILE IR SOLN
Status: DC | PRN
  Administered 2015-04-26: 3000 mL

## 2015-04-26 MED ORDER — ACETAMINOPHEN 650 MG RE SUPP: 650.0000 mg | Freq: Four times a day (QID) | RECTAL | Status: DC | PRN

## 2015-04-26 MED ORDER — TAMSULOSIN HCL 0.4 MG PO CAPS
0.4000 mg | ORAL_CAPSULE | Freq: Every day | ORAL | Status: DC
  Administered 2015-04-26 – 2015-04-27 (×2): 0.4 mg via ORAL
  Filled 2015-04-26 (×2): qty 1

## 2015-04-26 MED ORDER — ROCURONIUM BROMIDE 100 MG/10ML IV SOLN
INTRAVENOUS | Status: AC
  Filled 2015-04-26: qty 1

## 2015-04-26 MED ORDER — BUPIVACAINE-EPINEPHRINE (PF) 0.25% -1:200000 IJ SOLN
INTRAMUSCULAR | Status: AC
  Filled 2015-04-26: qty 30

## 2015-04-26 MED ORDER — MENTHOL 3 MG MT LOZG: 1.0000 | LOZENGE | OROMUCOSAL | Status: DC | PRN

## 2015-04-26 MED ORDER — ACETAMINOPHEN 325 MG PO TABS: 650.0000 mg | ORAL_TABLET | Freq: Four times a day (QID) | ORAL | Status: DC | PRN

## 2015-04-26 MED ORDER — LIDOCAINE HCL (CARDIAC) 20 MG/ML IV SOLN
INTRAVENOUS | Status: DC | PRN
  Administered 2015-04-26: 100 mg via INTRAVENOUS

## 2015-04-26 MED ORDER — CLINDAMYCIN PHOSPHATE 900 MG/50ML IV SOLN
INTRAVENOUS | Status: AC
  Filled 2015-04-26: qty 50

## 2015-04-26 MED ORDER — LEVOTHYROXINE SODIUM 50 MCG PO TABS
50.0000 ug | ORAL_TABLET | Freq: Every day | ORAL | Status: DC
  Administered 2015-04-27: 50 ug via ORAL
  Filled 2015-04-26 (×2): qty 1

## 2015-04-26 MED ORDER — LIDOCAINE HCL (CARDIAC) 20 MG/ML IV SOLN
INTRAVENOUS | Status: AC
  Filled 2015-04-26: qty 5

## 2015-04-26 MED ORDER — DEXTROSE 50 % IV SOLN
INTRAVENOUS | Status: DC | PRN
  Administered 2015-04-26: 15 mL via INTRAVENOUS
  Administered 2015-04-26: 10 mL via INTRAVENOUS

## 2015-04-26 MED ORDER — PROPOFOL 10 MG/ML IV BOLUS
INTRAVENOUS | Status: DC | PRN
  Administered 2015-04-26: 200 mg via INTRAVENOUS

## 2015-04-26 MED ORDER — HYDROGEN PEROXIDE 3 % EX SOLN
CUTANEOUS | Status: DC | PRN
  Administered 2015-04-26: 1

## 2015-04-26 MED ORDER — DEXAMETHASONE SODIUM PHOSPHATE 10 MG/ML IJ SOLN
10.0000 mg | Freq: Once | INTRAMUSCULAR | Status: AC
  Administered 2015-04-27: 10 mg via INTRAVENOUS
  Filled 2015-04-26: qty 1

## 2015-04-26 MED ORDER — PROMETHAZINE HCL 25 MG/ML IJ SOLN
6.2500 mg | INTRAMUSCULAR | Status: DC | PRN
Start: 2015-04-26 — End: 2015-04-26

## 2015-04-26 MED ORDER — BUPIVACAINE-EPINEPHRINE 0.25% -1:200000 IJ SOLN
INTRAMUSCULAR | Status: DC | PRN
  Administered 2015-04-26: 30 mL

## 2015-04-26 MED ORDER — HYDROGEN PEROXIDE 3 % EX SOLN
CUTANEOUS | Status: AC
  Filled 2015-04-26: qty 473

## 2015-04-26 MED ORDER — LIP MEDEX EX OINT
TOPICAL_OINTMENT | CUTANEOUS | Status: AC
  Filled 2015-04-26: qty 7

## 2015-04-26 MED ORDER — DEXTROSE 50 % IV SOLN
INTRAVENOUS | Status: AC
  Filled 2015-04-26: qty 50

## 2015-04-26 MED ORDER — ISOPROPYL ALCOHOL 70 % SOLN
Status: DC | PRN
  Administered 2015-04-26: 1 via TOPICAL

## 2015-04-26 MED ORDER — METFORMIN HCL 500 MG PO TABS
1000.0000 mg | ORAL_TABLET | Freq: Two times a day (BID) | ORAL | Status: DC
  Administered 2015-04-27 (×2): 1000 mg via ORAL
  Filled 2015-04-26 (×3): qty 2

## 2015-04-26 MED ORDER — SODIUM CHLORIDE 0.9 % IJ SOLN
INTRAMUSCULAR | Status: AC
  Filled 2015-04-26: qty 20

## 2015-04-26 MED ORDER — KETOROLAC TROMETHAMINE 30 MG/ML IJ SOLN
INTRAMUSCULAR | Status: DC | PRN
  Administered 2015-04-26: 30 mg via INTRAVENOUS

## 2015-04-26 MED ORDER — BUPIVACAINE HCL (PF) 0.5 % IJ SOLN
INTRAMUSCULAR | Status: AC
  Filled 2015-04-26: qty 90

## 2015-04-26 MED ORDER — ONDANSETRON HCL 4 MG PO TABS: 4.0000 mg | ORAL_TABLET | Freq: Four times a day (QID) | ORAL | Status: DC | PRN

## 2015-04-26 MED ORDER — FENTANYL CITRATE (PF) 250 MCG/5ML IJ SOLN
INTRAMUSCULAR | Status: AC
  Filled 2015-04-26: qty 5

## 2015-04-26 MED ORDER — KETOROLAC TROMETHAMINE 30 MG/ML IJ SOLN
INTRAMUSCULAR | Status: AC
  Filled 2015-04-26: qty 1

## 2015-04-26 MED ORDER — ACETAMINOPHEN 10 MG/ML IV SOLN
INTRAVENOUS | Status: AC
  Filled 2015-04-26: qty 100

## 2015-04-26 MED ORDER — ASPIRIN EC 325 MG PO TBEC
325.0000 mg | DELAYED_RELEASE_TABLET | Freq: Two times a day (BID) | ORAL | Status: DC
  Administered 2015-04-27 (×2): 325 mg via ORAL
  Filled 2015-04-26 (×3): qty 1

## 2015-04-26 MED ORDER — ONDANSETRON HCL 4 MG/2ML IJ SOLN: 4.0000 mg | Freq: Four times a day (QID) | INTRAMUSCULAR | Status: DC | PRN

## 2015-04-26 MED ORDER — METOCLOPRAMIDE HCL 10 MG PO TABS: 5.0000 mg | ORAL_TABLET | Freq: Three times a day (TID) | ORAL | Status: DC | PRN

## 2015-04-26 MED ORDER — SUGAMMADEX SODIUM 200 MG/2ML IV SOLN
INTRAVENOUS | Status: DC | PRN
  Administered 2015-04-26: 200 mg via INTRAVENOUS

## 2015-04-26 MED ORDER — KETOROLAC TROMETHAMINE 15 MG/ML IJ SOLN
15.0000 mg | Freq: Four times a day (QID) | INTRAMUSCULAR | Status: AC
  Administered 2015-04-26 – 2015-04-27 (×4): 15 mg via INTRAVENOUS
  Filled 2015-04-26 (×4): qty 1

## 2015-04-26 MED ORDER — 0.9 % SODIUM CHLORIDE (POUR BTL) OPTIME
TOPICAL | Status: DC | PRN
  Administered 2015-04-26: 500 mL

## 2015-04-26 MED ORDER — SUGAMMADEX SODIUM 200 MG/2ML IV SOLN
INTRAVENOUS | Status: AC
Start: 2015-04-26 — End: 2015-04-26
  Filled 2015-04-26: qty 2

## 2015-04-26 MED ORDER — DOCUSATE SODIUM 100 MG PO CAPS
100.0000 mg | ORAL_CAPSULE | Freq: Two times a day (BID) | ORAL | Status: DC
  Administered 2015-04-26 – 2015-04-27 (×2): 100 mg via ORAL

## 2015-04-26 MED ORDER — HYDROMORPHONE HCL 1 MG/ML IJ SOLN: 0.5000 mg | INTRAMUSCULAR | Status: DC | PRN

## 2015-04-26 MED ORDER — SODIUM CHLORIDE 0.9 % IJ SOLN
INTRAMUSCULAR | Status: AC
  Filled 2015-04-26: qty 50

## 2015-04-26 MED ORDER — HYDROMORPHONE HCL 1 MG/ML IJ SOLN
INTRAMUSCULAR | Status: DC | PRN
  Administered 2015-04-26: .6 mg via INTRAVENOUS
  Administered 2015-04-26: .2 mg via INTRAVENOUS
  Administered 2015-04-26: .4 mg via INTRAVENOUS

## 2015-04-26 MED ORDER — HYDROMORPHONE HCL 1 MG/ML IJ SOLN
0.2500 mg | INTRAMUSCULAR | Status: DC | PRN
  Administered 2015-04-26 (×2): 0.5 mg via INTRAVENOUS

## 2015-04-26 MED ORDER — ACETAMINOPHEN 10 MG/ML IV SOLN
INTRAVENOUS | Status: DC | PRN
  Administered 2015-04-26: 1000 mg via INTRAVENOUS

## 2015-04-26 MED ORDER — METOCLOPRAMIDE HCL 5 MG/ML IJ SOLN: 5.0000 mg | Freq: Three times a day (TID) | INTRAMUSCULAR | Status: DC | PRN

## 2015-04-26 MED ORDER — CHLORHEXIDINE GLUCONATE 4 % EX LIQD: 60.0000 mL | Freq: Once | CUTANEOUS | Status: DC

## 2015-04-26 MED ORDER — ROCURONIUM BROMIDE 100 MG/10ML IV SOLN
INTRAVENOUS | Status: DC | PRN
  Administered 2015-04-26: 10 mg via INTRAVENOUS
  Administered 2015-04-26: 50 mg via INTRAVENOUS

## 2015-04-26 MED ORDER — SENNA 8.6 MG PO TABS
2.0000 | ORAL_TABLET | Freq: Every day | ORAL | Status: DC
  Administered 2015-04-26: 17.2 mg via ORAL

## 2015-04-26 MED ORDER — METHOCARBAMOL 500 MG PO TABS
500.0000 mg | ORAL_TABLET | Freq: Four times a day (QID) | ORAL | Status: DC | PRN
  Administered 2015-04-27 (×2): 500 mg via ORAL
  Filled 2015-04-26 (×2): qty 1

## 2015-04-26 MED ORDER — DEXAMETHASONE SODIUM PHOSPHATE 10 MG/ML IJ SOLN
INTRAMUSCULAR | Status: DC | PRN
  Administered 2015-04-26: 8 mg via INTRAVENOUS

## 2015-04-26 MED ORDER — MIDAZOLAM HCL 2 MG/2ML IJ SOLN
INTRAMUSCULAR | Status: AC
  Filled 2015-04-26: qty 2

## 2015-04-26 MED ORDER — CLINDAMYCIN PHOSPHATE 900 MG/50ML IV SOLN
900.0000 mg | INTRAVENOUS | Status: AC
  Administered 2015-04-26: 900 mg via INTRAVENOUS

## 2015-04-26 MED ORDER — SODIUM CHLORIDE 0.9 % IV SOLN
INTRAVENOUS | Status: DC
  Administered 2015-04-26 – 2015-04-27 (×2): via INTRAVENOUS

## 2015-04-26 MED ORDER — METHOCARBAMOL 1000 MG/10ML IJ SOLN
500.0000 mg | Freq: Four times a day (QID) | INTRAVENOUS | Status: DC | PRN
  Administered 2015-04-26: 500 mg via INTRAVENOUS
  Filled 2015-04-26 (×2): qty 5

## 2015-04-26 MED ORDER — HYDROCODONE-ACETAMINOPHEN 5-325 MG PO TABS
1.0000 | ORAL_TABLET | ORAL | Status: DC | PRN
  Administered 2015-04-26 (×2): 1 via ORAL
  Administered 2015-04-27 (×4): 2 via ORAL
  Filled 2015-04-26: qty 1
  Filled 2015-04-26 (×4): qty 2
  Filled 2015-04-26: qty 1

## 2015-04-26 MED ORDER — ONDANSETRON HCL 4 MG/2ML IJ SOLN
INTRAMUSCULAR | Status: DC | PRN
  Administered 2015-04-26: 4 mg via INTRAVENOUS

## 2015-04-26 MED ORDER — SODIUM CHLORIDE 0.9 % IR SOLN
Status: DC | PRN
  Administered 2015-04-26: 1000 mL

## 2015-04-26 MED ORDER — GLYCOPYRROLATE 0.2 MG/ML IJ SOLN
INTRAMUSCULAR | Status: AC
  Filled 2015-04-26: qty 1

## 2015-04-26 MED ORDER — LACTATED RINGERS IV SOLN
INTRAVENOUS | Status: DC
  Administered 2015-04-26 (×4): via INTRAVENOUS

## 2015-04-26 SURGICAL SUPPLY — 42 items
BAG DECANTER FOR FLEXI CONT (MISCELLANEOUS) IMPLANT
BAG SPEC THK2 15X12 ZIP CLS (MISCELLANEOUS)
BAG ZIPLOCK 12X15 (MISCELLANEOUS) IMPLANT
CAPT HIP TOTAL 2 ×2 IMPLANT
CHLORAPREP W/TINT 26ML (MISCELLANEOUS) ×3 IMPLANT
CLOTH BEACON ORANGE TIMEOUT ST (SAFETY) ×3 IMPLANT
COVER PERINEAL POST (MISCELLANEOUS) ×3 IMPLANT
DECANTER SPIKE VIAL GLASS SM (MISCELLANEOUS) ×3 IMPLANT
DRAPE LG THREE QUARTER DISP (DRAPES) ×6 IMPLANT
DRAPE STERI IOBAN 125X83 (DRAPES) ×3 IMPLANT
DRAPE U-SHAPE 47X51 STRL (DRAPES) ×6 IMPLANT
DRSG AQUACEL AG ADV 3.5X10 (GAUZE/BANDAGES/DRESSINGS) ×3 IMPLANT
ELECT REM PT RETURN 15FT ADLT (MISCELLANEOUS) ×3 IMPLANT
GAUZE SPONGE 4X4 12PLY STRL (GAUZE/BANDAGES/DRESSINGS) ×3 IMPLANT
GLOVE BIO SURGEON STRL SZ8.5 (GLOVE) ×6 IMPLANT
GLOVE BIOGEL PI IND STRL 8.5 (GLOVE) ×1 IMPLANT
GLOVE BIOGEL PI INDICATOR 8.5 (GLOVE) ×2
GOWN SPEC L3 XXLG W/TWL (GOWN DISPOSABLE) ×3 IMPLANT
HANDPIECE INTERPULSE COAX TIP (DISPOSABLE) ×3
HOLDER FOLEY CATH W/STRAP (MISCELLANEOUS) ×3 IMPLANT
HOOD PEEL AWAY FLYTE STAYCOOL (MISCELLANEOUS) ×6 IMPLANT
LIQUID BAND (GAUZE/BANDAGES/DRESSINGS) ×4 IMPLANT
MARKER SKIN DUAL TIP RULER LAB (MISCELLANEOUS) ×3 IMPLANT
NDL SPNL 18GX3.5 QUINCKE PK (NEEDLE) ×1 IMPLANT
NEEDLE SPNL 18GX3.5 QUINCKE PK (NEEDLE) ×3 IMPLANT
PACK ANTERIOR HIP CUSTOM (KITS) ×3 IMPLANT
SAW OSC TIP CART 19.5X105X1.3 (SAW) ×3 IMPLANT
SEALER BIPOLAR AQUA 6.0 (INSTRUMENTS) ×3 IMPLANT
SET HNDPC FAN SPRY TIP SCT (DISPOSABLE) ×1 IMPLANT
SOL PREP POV-IOD 4OZ 10% (MISCELLANEOUS) ×3 IMPLANT
SUT ETHIBOND NAB CT1 #1 30IN (SUTURE) ×6 IMPLANT
SUT MNCRL AB 3-0 PS2 18 (SUTURE) ×3 IMPLANT
SUT MON AB 2-0 CT1 36 (SUTURE) ×6 IMPLANT
SUT VIC AB 1 CT1 36 (SUTURE) ×3 IMPLANT
SUT VIC AB 2-0 CT1 27 (SUTURE) ×3
SUT VIC AB 2-0 CT1 TAPERPNT 27 (SUTURE) ×1 IMPLANT
SUT VLOC 180 0 24IN GS25 (SUTURE) ×3 IMPLANT
SYR 50ML LL SCALE MARK (SYRINGE) ×3 IMPLANT
TRAY FOLEY W/METER SILVER 14FR (SET/KITS/TRAYS/PACK) IMPLANT
TRAY FOLEY W/METER SILVER 16FR (SET/KITS/TRAYS/PACK) IMPLANT
WATER STERILE IRR 1500ML POUR (IV SOLUTION) ×5 IMPLANT
YANKAUER SUCT BULB TIP 10FT TU (MISCELLANEOUS) ×3 IMPLANT

## 2015-04-26 NOTE — Anesthesia Procedure Notes (Signed)
Procedure Name: Intubation Date/Time: 04/26/2015 3:15 PM Performed by: Madyson Lukach, Nuala Alpha Pre-anesthesia Checklist: Patient identified, Emergency Drugs available, Suction available, Patient being monitored and Timeout performed Patient Re-evaluated:Patient Re-evaluated prior to inductionOxygen Delivery Method: Circle system utilized Preoxygenation: Pre-oxygenation with 100% oxygen Intubation Type: IV induction Ventilation: Mask ventilation without difficulty and Oral airway inserted - appropriate to patient size Laryngoscope Size: Mac and 4 Grade View: Grade II Tube type: Oral Tube size: 7.5 mm Number of attempts: 1 Airway Equipment and Method: Stylet Placement Confirmation: ETT inserted through vocal cords under direct vision,  positive ETCO2,  CO2 detector and breath sounds checked- equal and bilateral Secured at: 23 cm Tube secured with: Tape Dental Injury: Teeth and Oropharynx as per pre-operative assessment  Comments: Intubation by Dr Payton Doughty first attempt.

## 2015-04-26 NOTE — Interval H&P Note (Signed)
History and Physical Interval Note:  04/26/2015 3:04 PM  [name]  has presented today for surgery, with the diagnosis of avascular necrosis right hip  The various methods of treatment have been discussed with the patient and family. After consideration of risks, benefits and other options for treatment, the patient has consented to  Procedure(s): RIGHT TOTAL HIP ARTHROPLASTY ANTERIOR APPROACH (Right) as a surgical intervention .  The patient's history has been reviewed, patient examined, no change in status, stable for surgery.  I have reviewed the patient's chart and labs.  Questions were answered to the patient's satisfaction.     Dena Esperanza, Cloyde Reams

## 2015-04-26 NOTE — Anesthesia Preprocedure Evaluation (Addendum)
Anesthesia Evaluation  Patient identified by MRN, date of birth, ID band Patient awake    Reviewed: Allergy & Precautions, NPO status , Patient's Chart, lab work & pertinent test results  Airway Mallampati: II  TM Distance: >3 FB Neck ROM: Full    Dental no notable dental hx.    Pulmonary former smoker,    Pulmonary exam normal breath sounds clear to auscultation       Cardiovascular negative cardio ROS Normal cardiovascular exam Rhythm:Regular Rate:Normal     Neuro/Psych negative neurological ROS  negative psych ROS   GI/Hepatic negative GI ROS, Neg liver ROS,   Endo/Other  diabetes, Type 2, Oral Hypoglycemic AgentsHypothyroidism   Renal/GU negative Renal ROS  negative genitourinary   Musculoskeletal  (+) Arthritis ,   Abdominal   Peds negative pediatric ROS (+)  Hematology negative hematology ROS (+)   Anesthesia Other Findings   Reproductive/Obstetrics negative OB ROS                             Anesthesia Physical Anesthesia Plan  ASA: II  Anesthesia Plan: General   Post-op Pain Management:    Induction: Intravenous  Airway Management Planned: Oral ETT  Additional Equipment:   Intra-op Plan:   Post-operative Plan: Extubation in OR  Informed Consent: I have reviewed the patients History and Physical, chart, labs and discussed the procedure including the risks, benefits and alternatives for the proposed anesthesia with the patient or authorized representative who has indicated his/her understanding and acceptance.   Dental advisory given  Plan Discussed with: CRNA  Anesthesia Plan Comments: (Long discussion of general and spinal anesthesia. 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

## 2015-04-26 NOTE — Discharge Instructions (Signed)
°Dr. Kiyara Bouffard °Joint Replacement Specialist °[facility] °[address] °[place], [place] [address] °[phone] ° ° °TOTAL HIP REPLACEMENT POSTOPERATIVE DIRECTIONS ° ° ° °Hip Rehabilitation, Guidelines Following Surgery  ° °WEIGHT BEARING °Weight bearing as tolerated with assist device (walker, cane, etc) as directed, use it as long as suggested by your surgeon or therapist, typically at least 4-6 weeks. ° °The results of a hip operation are greatly improved after range of motion and muscle strengthening exercises. Follow all safety measures which are given to protect your hip. If any of these exercises cause increased pain or swelling in your joint, decrease the amount until you are comfortable again. Then slowly increase the exercises. Call your caregiver if you have problems or questions.  ° °HOME CARE INSTRUCTIONS  °Most of the following instructions are designed to prevent the dislocation of your new hip.  °Remove items at home which could result in a fall. This includes throw rugs or furniture in walking pathways.  °Continue medications as instructed at time of discharge. °· You may have some home medications which will be placed on hold until you complete the course of blood thinner medication. °· You may start showering once you are discharged home. Do not remove your dressing. °Do not put on socks or shoes without following the instructions of your caregivers.   °Sit on chairs with arms. Use the chair arms to help push yourself up when arising.  °Arrange for the use of a toilet seat elevator so you are not sitting low.  °· Walk with walker as instructed.  °You may resume a sexual relationship in one month or when given the OK by your caregiver.  °Use walker as long as suggested by your caregivers.  °You may put full weight on your legs and walk as much as is comfortable. °Avoid periods of inactivity such as sitting longer than an hour when not asleep. This helps prevent  blood clots.  °You may return to work once you are cleared by your surgeon.  °Do not drive a car for 6 weeks or until released by your surgeon.  °Do not drive while taking narcotics.  °Wear elastic stockings for two weeks following surgery during the day but you may remove then at night.  °Make sure you keep all of your appointments after your operation with all of your doctors and caregivers. You should call the office at the above phone number and make an appointment for approximately two weeks after the date of your surgery. °Please pick up a stool softener and laxative for home use as long as you are requiring pain medications. °· ICE to the affected hip every three hours for 30 minutes at a time and then as needed for pain and swelling. Continue to use ice on the hip for pain and swelling from surgery. You may notice swelling that will progress down to the foot and ankle.  This is normal after surgery.  Elevate the leg when you are not up walking on it.   °It is important for you to complete the blood thinner medication as prescribed by your doctor. °· Continue to use the breathing machine which will help keep your temperature down.  It is common for your temperature to cycle up and down following surgery, especially at night when you are not up moving around and exerting yourself.  The breathing machine keeps your lungs expanded and your temperature down. ° °RANGE OF MOTION AND STRENGTHENING EXERCISES  °These exercises are   designed to help you keep full movement of your hip joint. Follow your caregiver's or physical therapist's instructions. Perform all exercises about fifteen times, three times per day or as directed. Exercise both hips, even if you have had only one joint replacement. These exercises can be done on a training (exercise) mat, on the floor, on a table or on a bed. Use whatever works the best and is most comfortable for you. Use music or television while you are exercising so that the exercises  are a pleasant break in your day. This will make your life better with the exercises acting as a break in routine you can look forward to.  °Lying on your back, slowly slide your foot toward your buttocks, raising your knee up off the floor. Then slowly slide your foot back down until your leg is straight again.  °Lying on your back spread your legs as far apart as you can without causing discomfort.  °Lying on your side, raise your upper leg and foot straight up from the floor as far as is comfortable. Slowly lower the leg and repeat.  °Lying on your back, tighten up the muscle in the front of your thigh (quadriceps muscles). You can do this by keeping your leg straight and trying to raise your heel off the floor. This helps strengthen the largest muscle supporting your knee.  °Lying on your back, tighten up the muscles of your buttocks both with the legs straight and with the knee bent at a comfortable angle while keeping your heel on the floor.  ° °SKILLED REHAB INSTRUCTIONS: °If the patient is transferred to a skilled rehab facility following release from the hospital, a list of the current medications will be sent to the facility for the patient to continue.  When discharged from the skilled rehab facility, please have the facility set up the patient's Home Health Physical Therapy prior to being released. Also, the skilled facility will be responsible for providing the patient with their medications at time of release from the facility to include their pain medication and their blood thinner medication. If the patient is still at the rehab facility at time of the two week follow up appointment, the skilled rehab facility will also need to assist the patient in arranging follow up appointment in our office and any transportation needs. ° °MAKE SURE YOU:  °Understand these instructions.  °Will watch your condition.  °Will get help right away if you are not doing well or get worse. ° °Pick up stool softner and  laxative for home use following surgery while on pain medications. °Do not remove your dressing. °The dressing is waterproof--it is OK to take showers. °Continue to use ice for pain and swelling after surgery. °Do not use any lotions or creams on the incision until instructed by your surgeon. °Total Hip Protocol. ° ° °

## 2015-04-26 NOTE — H&P (View-Only) (Signed)
TOTAL HIP ADMISSION H&P  Patient is admitted for right total hip arthroplasty.  Subjective:  Chief Complaint: right hip pain  HPI: [name], 60 y.o. 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  Onset of symptoms was gradual starting >10 years ago with gradually worsening course since that time.The patient noted prior procedures of the hip to include arthroplasty on the left hip(s).  Patient currently rates pain in the right hip at 10 out of 10 with activity. Patient has night pain, worsening of pain with activity and weight bearing, pain that interfers with activities of daily living and pain with passive range of motion. Patient has evidence of subchondral cysts, subchondral sclerosis, periarticular osteophytes and joint space narrowing by imaging studies. This condition presents safety issues increasing the risk of falls. There is no current active infection.  There are no active problems to display for this patient.  No past medical history on file.  DM II Past Surgical History  Procedure Laterality Date  . Joint replacement       (Not in a hospital admission) Allergies  Allergen Reactions  . Penicillins Other (See Comments)    Hallucinations, Syncope    Social History  Substance Use Topics  . Smoking status: Never Smoker   . Smokeless tobacco: Not on file  . Alcohol Use: Yes    No family history on file.   Review of Systems  Constitutional: Negative.   HENT: Negative.   Eyes: Negative.   Respiratory: Negative.   Cardiovascular: Negative.   Gastrointestinal: Negative.   Genitourinary: Negative.   Musculoskeletal: Positive for joint pain.  Skin: Negative.   Neurological:  Negative.   Endo/Heme/Allergies: Negative.   Psychiatric/Behavioral: Negative.     Objective:  Physical Exam  Vitals reviewed. Constitutional: He is oriented to person, place, and time. He appears well-developed.  HENT:  Head: Normocephalic and atraumatic.  Eyes: Conjunctivae and EOM are normal. Pupils are equal, round, and reactive to light.  Neck: Normal range of motion. Neck supple.  Cardiovascular: Normal rate, regular rhythm, normal heart sounds and intact distal pulses.   Respiratory: Breath sounds normal. No respiratory distress.  GI: Soft. Bowel sounds are normal. He exhibits no distension.  Genitourinary:  deferred  Musculoskeletal:       Right hip: He exhibits decreased range of motion and bony tenderness.  Neurological: He is alert and oriented to person, place, and time. He has normal reflexes.  Skin: Skin is warm and dry.  Psychiatric: He has a normal mood and affect. His behavior is normal. Judgment and thought content normal.    Vital signs in last 24 hours: @  Labs:   Estimated body mass index is 35.63 kg/(m^2) as calculated from the following:   Height as of 01/30/14:  (1.854 m).   Weight as of 01/30/14: 122.471 kg (270 lb).   Imaging Review Plain radiographs demonstrate severe degenerative joint disease of the right hip(s). The bone quality appears to be adequate for age and reported activity level.  Assessment/Plan:  End stage arthritis, right hip(s)  The patient history, physical examination, clinical judgement of the provider and imaging studies are consistent with end stage degenerative joint disease of the right hip(s) and total hip arthroplasty is deemed medically necessary. The treatment  options including medical management, injection therapy, arthroscopy and arthroplasty were discussed at length. The risks and benefits of total hip arthroplasty were presented and reviewed. The risks due to aseptic loosening, infection, stiffness,  dislocation/subluxation,  thromboembolic complications and other imponderables were discussed.  The patient acknowledged the explanation, agreed to proceed with the plan and consent was signed. Patient is being admitted for inpatient treatment for surgery, pain control, PT, OT, prophylactic antibiotics, VTE prophylaxis, progressive ambulation and ADL's and discharge planning.The patient is planning to be discharged home with home health services

## 2015-04-26 NOTE — Anesthesia Postprocedure Evaluation (Signed)
Anesthesia Post Note  Patient: [name]  Procedure(s) Performed: Procedure(s) (LRB): RIGHT TOTAL HIP ARTHROPLASTY ANTERIOR APPROACH (Right)  Patient location during evaluation: PACU Anesthesia Type: General Level of consciousness: awake and alert Pain management: pain level controlled Vital Signs Assessment: post-procedure vital signs reviewed and stable Respiratory status: spontaneous breathing, nonlabored ventilation, respiratory function stable and patient connected to nasal cannula oxygen Cardiovascular status: blood pressure returned to baseline and stable Postop Assessment: no signs of nausea or vomiting Anesthetic complications: no    Last Vitals:  Filed Vitals:   04/26/15 1900 04/26/15 1915  BP: 134/82 142/86  Pulse: 108 100  Temp:    Resp: 22 13    Last Pain:  Filed Vitals:   04/26/15 1919  PainSc: Asleep                 Fletcher Rathbun J

## 2015-04-26 NOTE — Progress Notes (Signed)
Notified Dr. Council Mechanic of [name]  CBG 19 upon arrival to short stay. Pt is asymptomatic at this time.  New orders given and followed. See MAR.  CBG when taken to holding room 162.  He is without complaints and stable at time of transfer to Holding Room.

## 2015-04-26 NOTE — Discharge Summary (Signed)
Physician Discharge Summary  Patient ID: [name] MRN: [id number] DOB/AGE: 1955/09/14 60 y.o.  Admit date: 04/26/2015 Discharge date: 04/27/2015  Admission Diagnoses:  Primary osteoarthritis of right hip  Discharge Diagnoses:  Principal Problem:   Primary osteoarthritis of right hip   Past Medical History  Diagnosis Date  . Hypothyroidism   . Diabetes mellitus without complication (HCC)   . Urinary hesitancy   . Enlarged prostate   . Arthritis     Surgeries: Procedure(s): RIGHT TOTAL HIP ARTHROPLASTY ANTERIOR APPROACH on 04/26/2015   Consultants (if any):    Discharged Condition: Improved  Hospital Course: [name] is an 60 y.o. male who was admitted 04/26/2015 with a diagnosis of Primary osteoarthritis of right hip and went to the operating room on 04/26/2015 and underwent the above named procedures.    He was given perioperative antibiotics:      Anti-infectives    Start     Dose/Rate Route Frequency Ordered Stop   04/26/15 2100  clindamycin (CLEOCIN) IVPB 600 mg     600 mg 100 mL/hr over 30 Minutes Intravenous Every 6 hours 04/26/15 1952 04/27/15 0323   04/26/15 1022  clindamycin (CLEOCIN) IVPB 900 mg     900 mg 100 mL/hr over 30 Minutes Intravenous On call to O.R. 04/26/15 1022 04/26/15 1519    .  He was given sequential compression devices, early ambulation, and ASA for DVT prophylaxis.  He benefited maximally from the hospital stay and there were no complications.    Recent vital signs:  Filed Vitals:   04/27/15 0134 04/27/15 0608  BP: 118/63 120/65  Pulse: 89 84  Temp: 98.8 F (37.1 C) 98.4 F (36.9 C)  Resp: 16 16    Recent laboratory studies:  Lab Results  Component Value Date   HGB 11.3* 04/27/2015   HGB 14.0 04/19/2015   HGB 10.5* 02/07/2010   Lab Results  Component Value Date   WBC 12.6* 04/27/2015   PLT 192 04/27/2015   Lab Results  Component Value Date   INR 1.02 04/19/2015   Lab Results  Component Value Date   NA  138 04/27/2015   K 4.2 04/27/2015   CL 105 04/27/2015   CO2 26 04/27/2015   BUN 10 04/27/2015   CREATININE 1.01 04/27/2015   GLUCOSE 171* 04/27/2015    Discharge Medications:     Medication List    STOP taking these medications        Acetaminophen-Codeine 300-30 MG tablet     griseofulvin 250 MG tablet  Commonly known as:  GRIS-PEG      TAKE these medications        aspirin EC 325 MG tablet  Take 1 tablet (325 mg total) by mouth 2 (two) times daily after a meal.     diclofenac 75 MG EC tablet  Commonly known as:  VOLTAREN  Take 1 tablet (75 mg total) by mouth 2 (two) times daily.     docusate sodium 100 MG capsule  Commonly known as:  COLACE  Take 1 capsule (100 mg total) by mouth 2 (two) times daily.     famotidine 20 MG tablet  Commonly known as:  PEPCID  Take 1 tablet (20 mg total) by mouth 2 (two) times daily.     HYDROcodone-acetaminophen 5-325 MG tablet  Commonly known as:  NORCO  Take 1-2 tablets by mouth every 4 (four) hours as needed for moderate pain.     levothyroxine 50 MCG tablet  Commonly known as:  SYNTHROID, LEVOTHROID  Take 50 mcg by mouth daily before breakfast.     metFORMIN 500 MG tablet  Commonly known as:  GLUCOPHAGE  Take 1,000 mg by mouth 2 (two) times daily with a meal.     ondansetron 4 MG tablet  Commonly known as:  ZOFRAN  Take 1 tablet (4 mg total) by mouth every 8 (eight) hours as needed for nausea or vomiting.     senna 8.6 MG Tabs tablet  Commonly known as:  SENOKOT  Take 2 tablets (17.2 mg total) by mouth at bedtime.     tamsulosin 0.4 MG Caps capsule  Commonly known as:  FLOMAX  Take 0.4 mg by mouth daily.        Diagnostic Studies: Dg Pelvis Portable  04/26/2015  CLINICAL DATA:  Postop right hip surgery EXAM: PORTABLE PELVIS 1-2 VIEWS COMPARISON:  Left hip 02/06/2011 FINDINGS: Single portable view of the pelvis submitted. There is right hip prosthesis with anatomic alignment. Postsurgical changes are noted is small  periarticular soft tissue air. Again noted left hip prosthesis with anatomic alignment. A penile catheter is partially visualized. IMPRESSION: Right hip prosthesis with anatomic alignment. Postsurgical changes are noted. Stable left hip prosthesis with anatomic alignment. Electronically Signed   By: Natasha Mead M.D.   On: 04/26/2015 18:34   Dg C-arm 61-120 Min-no Report  04/26/2015  CLINICAL DATA: surgery C-ARM 61-120 MINUTES Fluoroscopy was utilized by the requesting physician.  No radiographic interpretation.   Dg Hip Operative Unilat With Pelvis Right  04/26/2015  CLINICAL DATA:  Right hip replacement EXAM: OPERATIVE right HIP (WITH PELVIS IF PERFORMED) 1 VIEW TECHNIQUE: Fluoroscopic spot image(s) were submitted for interpretation post-operatively. COMPARISON:  Pelvis 02/06/2011 FINDINGS: Intraoperative fluoroscopy is utilized for surgical control purposes. Fluoroscopy time is recorded at 32 seconds. Exposure recorded at 5.15 mGy. A single spot fluoroscopic images obtained. Intraoperative spot fluoroscopic image demonstrates placement of a right total hip arthroplasty using non cemented components. Components appear well seated. Somewhat shallow appearance of the acetabular angle. No acute fracture or dislocation. No radiopaque soft tissue foreign bodies. IMPRESSION: Intraoperative fluoroscopy is obtained for surgical control purposes demonstrating placement of right total hip arthroplasty. Electronically Signed   By: Burman Nieves M.D.   On: 04/26/2015 21:16    Disposition: 01-Home or Self Care  Discharge Instructions    Call MD / Call 911    Complete by:  As directed   If you experience chest pain or shortness of breath, CALL 911 and be transported to the hospital emergency room.  If you develope a fever above 101 F, pus (white drainage) or increased drainage or redness at the wound, or calf pain, call your surgeon's office.     Constipation Prevention    Complete by:  As directed   Drink  plenty of fluids.  Prune juice may be helpful.  You may use a stool softener, such as Colace (over the counter) 100 mg twice a day.  Use MiraLax (over the counter) for constipation as needed.     Diet - low sodium heart healthy    Complete by:  As directed      Driving restrictions    Complete by:  As directed   No driving for 6 weeks     Increase activity slowly as tolerated    Complete by:  As directed      Lifting restrictions    Complete by:  As directed   No lifting for 6 weeks  TED hose    Complete by:  As directed   Use stockings (TED hose) for 2 weeks on both leg(s).  You may remove them at night for sleeping.           Follow-up Information    Follow up with Jamiel Goncalves, Cloyde Reams, MD. Schedule an appointment as soon as possible for a visit in 2 weeks.   Specialty:  Orthopedic Surgery   Why:  For wound re-check   Contact information:   [address] [place] [address] [phone]        Signed: Chidubem, Chaires 04/27/2015, 7:13 AM

## 2015-04-26 NOTE — Transfer of Care (Signed)
Immediate Anesthesia Transfer of Care Note  Patient: [name]  Procedure(s) Performed: Procedure(s): RIGHT TOTAL HIP ARTHROPLASTY ANTERIOR APPROACH (Right)  Patient Location: PACU  Anesthesia Type:General  Level of Consciousness: sedated, patient cooperative and responds to stimulation  Airway & Oxygen Therapy: Patient Spontanous Breathing and Patient connected to face mask oxygen  Post-op Assessment: Report given to RN and Post -op Vital signs reviewed and stable  Post vital signs: Reviewed and stable  Last Vitals:  Filed Vitals:   04/26/15 1023  BP: 145/90  Pulse: 82  Temp: 36.4 C  Resp: 18    Complications: No apparent anesthesia complications

## 2015-04-26 NOTE — Op Note (Signed)
OPERATIVE REPORT  SURGEON: Samson Frederic, MD   ASSISTANT: Skip Mayer, PA-C.  PREOPERATIVE DIAGNOSIS: Right hip arthritis.   POSTOPERATIVE DIAGNOSIS: Right hip arthritis.   PROCEDURE: Right total hip arthroplasty, anterior approach.   IMPLANTS: DePuy Tri Lock stem, size 6, std offset. DePuy Pinnacle Cup, size 58 mm. DePuy Altrx liner, size 36 by 58 mm, +4 neutral. DePuy metal head ball, size 36 + -2 mm.  ANESTHESIA:  General  ESTIMATED BLOOD LOSS: 350 mL.    ANTIBIOTICS: 900 mg clindamycin.  DRAINS: None.  COMPLICATIONS: None.   CONDITION: PACU - hemodynamically stable.Marland Kitchen   BRIEF CLINICAL NOTE: [name] is a 60 y.o. male with a long-standing history of Right hip arthritis. After failing conservative management, the patient was indicated for total hip arthroplasty. The risks, benefits, and alternatives to the procedure were explained, and the patient elected to proceed.  PROCEDURE IN DETAIL: Surgical site was marked by myself. Spinal anesthesia was obtained in the pre-op holding area. Once inside the operative room, a foley catheter was inserted. The patient was then positioned on the Hana table. All bony prominences were well padded. The hip was prepped and draped in the normal sterile surgical fashion. A time-out was called verifying side and site of surgery. The patient received IV antibiotics within 60 minutes of beginning the procedure.  The direct anterior approach to the hip was performed through the Hueter interval. Lateral femoral circumflex vessels were treated with the Auqumantys. The anterior capsule was exposed and an inverted T capsulotomy was made.The femoral neck cut was made to the level of the templated cut. A corkscrew was placed into the head and the head was removed. The femoral head was found to have eburnated bone. The head was passed to the back table and was measured.  Acetabular exposure was achieved, and the pulvinar and labrum  were excised. Sequental reaming of the acetabulum was then performed up to a size 57 mm reamer. A 58 mm cup was then opened and impacted into place at approximately 40 degrees of abduction and 20 degrees of anteversion. The final polyethylene liner was impacted into place and acetabular osteophytes were removed.   I then gained femoral exposure taking care to protect the abductors and greater trochanter. This was performed using standard external rotation, extension, and adduction. The capsule was peeled off the inner aspect of the greater trochanter, taking care to preserve the short external rotators. A cookie cutter was used to enter the femoral canal, and then the femoral canal finder was placed. Sequential broaching was performed up to a size 6. Calcar planer was used on the femoral neck remnant. I placed a std offset neck and a trial head ball. The hip was reduced. Leg lengths and offset were checked fluoroscopically. The hip was dislocated and trial components were removed. The final implants were placed, and the hip was reduced.  Fluoroscopy was used to confirm component position and leg lengths. At 90 degrees of external rotation and full extension, the hip was stable to an anterior directed force.  The wound was copiously irrigated with a dilute betadine solution followed by normal saline. Marcaine solution was injected into the periarticular soft tissue. The wound was closed in layers using #1 Vicryl and V-Loc for the fascia, 2-0 Vicryl for the subcutaneous fat, 2-0 Monocryl for the deep dermal layer, 3-0 running Monocryl subcuticular stitch, and Dermabond for the skin. Once the glue was fully dried, an Aquacell Ag dressing was applied. The patient was transported to  the recovery room in stable condition. Sponge, needle, and instrument counts were correct at the end of the case x2. The patient tolerated the procedure well and there were no known complications.  Please note that a  surgical assistant was a medical necessity for this procedure to perform it in a safe and expeditious manner. Assistant was necessary to provide appropriate retraction of vital neurovascular structures, to prevent femoral fracture, and to allow for anatomic placement of the prosthesis.

## 2015-04-27 ENCOUNTER — Encounter (HOSPITAL_COMMUNITY): Payer: Self-pay | Admitting: Orthopedic Surgery

## 2015-04-27 LAB — BASIC METABOLIC PANEL
Anion gap: 7 (ref 5–15)
BUN: 10 mg/dL (ref 6–20)
CHLORIDE: 105 mmol/L (ref 101–111)
CO2: 26 mmol/L (ref 22–32)
CREATININE: 1.01 mg/dL (ref 0.61–1.24)
Calcium: 8.8 mg/dL — ABNORMAL LOW (ref 8.9–10.3)
GFR calc Af Amer: >60 mL/min (ref >60)
GFR calc non Af Amer: >60 mL/min (ref >60)
GLUCOSE: 171 mg/dL — AB (ref 65–99)
Potassium: 4.2 mmol/L (ref 3.5–5.1)
SODIUM: 138 mmol/L (ref 135–145)

## 2015-04-27 LAB — CBC
HEMATOCRIT: 34 % — AB (ref 39.0–52.0)
HEMOGLOBIN: 11.3 g/dL — AB (ref 13.0–17.0)
MCH: 28.2 pg (ref 26.0–34.0)
MCHC: 33.2 g/dL (ref 30.0–36.0)
MCV: 84.8 fL (ref 78.0–100.0)
Platelets: 192 10*3/uL (ref 150–400)
RBC: 4.01 MIL/uL — ABNORMAL LOW (ref 4.22–5.81)
RDW: 13.8 % (ref 11.5–15.5)
WBC: 12.6 10*3/uL — ABNORMAL HIGH (ref 4.0–10.5)

## 2015-04-27 LAB — GLUCOSE, CAPILLARY
GLUCOSE-CAPILLARY: 124 mg/dL — AB (ref 65–99)
GLUCOSE-CAPILLARY: 131 mg/dL — AB (ref 65–99)
Glucose-Capillary: 123 mg/dL — ABNORMAL HIGH (ref 65–99)

## 2015-04-27 MED ORDER — HYDROCODONE-ACETAMINOPHEN 5-325 MG PO TABS: 1.0000 | ORAL_TABLET | ORAL | Status: AC | PRN

## 2015-04-27 MED ORDER — ASPIRIN EC 325 MG PO TBEC: 325.0000 mg | DELAYED_RELEASE_TABLET | Freq: Two times a day (BID) | ORAL | Status: AC

## 2015-04-27 MED ORDER — SENNA 8.6 MG PO TABS: 2.0000 | ORAL_TABLET | Freq: Every day | ORAL | Status: AC

## 2015-04-27 MED ORDER — ONDANSETRON HCL 4 MG PO TABS: 4.0000 mg | ORAL_TABLET | Freq: Three times a day (TID) | ORAL | Status: AC | PRN

## 2015-04-27 MED ORDER — FAMOTIDINE 20 MG PO TABS: 20.0000 mg | ORAL_TABLET | Freq: Two times a day (BID) | ORAL | Status: AC

## 2015-04-27 MED ORDER — DOCUSATE SODIUM 100 MG PO CAPS: 100.0000 mg | ORAL_CAPSULE | Freq: Two times a day (BID) | ORAL | Status: AC

## 2015-04-27 MED ORDER — DICLOFENAC SODIUM 75 MG PO TBEC: 75.0000 mg | DELAYED_RELEASE_TABLET | Freq: Two times a day (BID) | ORAL | Status: AC

## 2015-04-27 NOTE — Evaluation (Signed)
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  Pt should progress well to dc home with family assist and HHPT follow up.    Follow Up Recommendations Home health PT    Equipment Recommendations  Rolling walker with 5" wheels    Recommendations for Other Services OT consult     Precautions / Restrictions Precautions Precautions: Fall Restrictions Weight Bearing Restrictions: No Other Position/Activity Restrictions: WBAT      Mobility  Bed Mobility Overal bed mobility: Needs Assistance Bed Mobility: Supine to Sit     Supine to sit: Min assist     General bed mobility comments: cues for sequence and use of L LE to self assist  Transfers Overall transfer level: Needs assistance Equipment used: Rolling walker (2 wheeled) Transfers: Sit to/from Stand Sit to Stand: Min assist;Min guard         General transfer comment: cues for LE management and use of UEs to self assist  Ambulation/Gait Ambulation/Gait assistance: Min assist;Min guard Ambulation Distance (Feet): 150 Feet Assistive device: Rolling walker (2 wheeled) Gait Pattern/deviations: Step-to pattern;Step-through pattern;Decreased step length - right;Decreased step length - left;Shuffle;Trunk flexed Gait velocity: decr Gait velocity interpretation: Below normal speed for age/gender General Gait Details: cues for sequence, posture and position from [employer]            Wheelchair Mobility    Modified Rankin (Stroke Patients Only)       Balance                                             Pertinent Vitals/Pain Pain Assessment: 0-10 Pain Score: 2  Pain Location: L hip Pain Descriptors / Indicators: Aching;Sore Pain  Intervention(s): Limited activity within patient's tolerance;Monitored during session;Premedicated before session;Ice applied    Home Living Family/patient expects to be discharged to:: Private residence Living Arrangements: Children Available Help at Discharge: Family;Available 24 hours/day Type of Home: House Home Access: Stairs to enter   [employer] of Steps: 1+1 Home Layout: One level Home Equipment: Bedside commode      Prior Function Level of Independence: Independent               Hand Dominance        Extremity/Trunk Assessment   Upper Extremity Assessment: Overall WFL for tasks assessed           Lower Extremity Assessment: RLE deficits/detail RLE Deficits / Details: Strength at hip 2+/5 with AAROM at hip to 85 flex and 20 abd    Cervical / Trunk Assessment: Normal  Communication   Communication: No difficulties  Cognition Arousal/Alertness: Awake/alert Behavior During Therapy: WFL for tasks assessed/performed Overall Cognitive Status: Within Functional Limits for tasks assessed                      General Comments      Exercises Total Joint Exercises Ankle Circles/Pumps: AROM;Both;15 reps;Supine Quad Sets: AROM;Both;10 reps;Supine Heel Slides: AAROM;Right;20 reps;Supine Hip ABduction/ADduction: AAROM;Right;15 reps;Supine      Assessment/Plan    PT Assessment Patient needs continued PT services  PT Diagnosis Difficulty walking   PT Problem List  Decreased strength;Decreased range of motion;Decreased activity tolerance;Decreased mobility;Decreased knowledge of use of DME;Pain;Decreased knowledge of precautions  PT Treatment Interventions DME instruction;Gait training;Stair training;Functional mobility training;Therapeutic activities;Therapeutic exercise;Patient/family education   PT Goals (Current goals can be found in the Care Plan section) Acute Rehab PT Goals Patient Stated Goal: home PT Goal Formulation: With  patient Time For Goal Achievement: 04/29/15 Potential to Achieve Goals: Good    Frequency 7X/week   Barriers to discharge        Co-evaluation               End of Session Equipment Utilized During Treatment: Gait belt Activity Tolerance: Patient tolerated treatment well Patient left: in chair;with call bell/phone within reach Nurse Communication: Mobility status         Time: 1610-9604 PT Time Calculation (min) (ACUTE ONLY): 42 min   Charges:   PT Evaluation $PT Eval Low Complexity: 1 Procedure PT Treatments $Gait Training: 8-22 mins $Therapeutic Exercise: 8-22 mins   PT G Codes:        [name] 2015-05-23, 12:49 PM

## 2015-04-27 NOTE — Evaluation (Signed)
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  All education was completed. No further OT is needed at this time    Follow Up Recommendations  Supervision/Assistance - 24 hour;No OT follow up    Equipment Recommendations  None recommended by OT    Recommendations for Other Services       Precautions / Restrictions Precautions Precautions: Fall Restrictions Weight Bearing Restrictions: No      Mobility Bed Mobility               General bed mobility comments: oob  Transfers Overall transfer level: Needs assistance Equipment used: Rolling walker (2 wheeled) Transfers: Sit to/from Stand Sit to Stand: Min guard         General transfer comment: cues for UE/LE placement; steadying assistance    Balance                                            ADL Overall ADL's : Needs assistance/impaired                         Toilet Transfer: Min guard;Ambulation;BSC;RW             General ADL Comments: performed ADL at sink and sitting on commode.  Pt able to stand for UB with min guard, mod A for LB ADLs.  He may have a reacher at home:  he will have 24/7 between his sons.  Educated on sidestepping over tub when ready.  Pt demonstrates good safety with RW     Vision     Perception     Praxis      Pertinent Vitals/Pain Pain Assessment: 0-10 Pain Score: 2  Pain Location: L hip Pain Descriptors / Indicators: Sore Pain Intervention(s): Limited activity within patient's tolerance;Monitored during session;Premedicated before session;Repositioned;Ice applied     Hand Dominance     Extremity/Trunk Assessment Upper Extremity Assessment Upper Extremity Assessment: Overall WFL for tasks assessed            Communication Communication Communication: No difficulties   Cognition Arousal/Alertness: Awake/alert Behavior During Therapy: WFL for tasks assessed/performed Overall Cognitive Status: Within Functional Limits for tasks assessed                     General Comments       Exercises       Shoulder Instructions      Home Living Family/patient expects to be discharged to:: Private residence Living Arrangements: Children Available Help at Discharge: Family;Available 24 hours/day               Bathroom Shower/Tub: Chief Strategy Officer: Standard     Home Equipment: Bedside commode          Prior Functioning/Environment Level of Independence: Independent             OT Diagnosis: Acute pain   OT Problem List:     OT Treatment/Interventions:      OT Goals(Current goals can be found in the care plan section) Acute Rehab OT Goals Patient Stated Goal: home OT Goal Formulation: All assessment and education complete,  DC therapy  OT Frequency:     Barriers to D/C:            Co-evaluation              End of Session    Activity Tolerance: Patient tolerated treatment well Patient left: in chair;with call bell/phone within reach   Time: 3244-0102 OT Time Calculation (min): 32 min Charges:  OT General Charges $OT Visit: 1 Procedure OT Evaluation $OT Eval Low Complexity: 1 Procedure OT Treatments $Self Care/Home Management : 8-22 mins G-Codes:    Jerolyn Flenniken 05-06-2015, 10:08 AM Marica Otter, OTR/L [phone] 06-May-2015

## 2015-04-27 NOTE — Care Management Note (Addendum)
Case Management Note  Patient Details  Name: COLTER MAGOWAN MRN: [id number] Date of Birth: Oct 30, 1955  Subjective/Objective:    S/p Right total hip arthroplasty, anterior approach                Action/Plan: Discharge planning, spoke with patient at bedside. Have chosen [facility] for [facility]. Contacted [facility] for referral. Needs RW and 3-n-1, [facility], faxed orders to [phone], awaiting confirmation.  Expected Discharge Date:                  Expected Discharge Plan:  Home w Home Health Services  In-House Referral:  NA  Discharge planning Services  CM Consult  Post Acute Care Choice:  Durable Medical Equipment, Home Health Choice offered to:  Patient  DME Arranged:  3-N-1, Walker rolling DME Agency:  [employer], [place]  HH Arranged:  PT [facility] Agency:  [facility]  Status of Service:  Completed, signed off  Medicare Important Message Given:    Date Medicare IM Given:    Medicare IM give by:    Date Additional Medicare IM Given:    Additional Medicare Important Message give by:     If discussed at Long Length of Stay Meetings, dates discussed:    Additional Comments:  Alexis Goodell, RN 04/27/2015, 10:31 AM [phone]

## 2015-04-27 NOTE — Progress Notes (Signed)
   Subjective:  Patient reports pain as mild to moderate.  No c/o.  Objective:   VITALS:   Filed Vitals:   04/26/15 2149 04/26/15 2250 04/27/15 0134 04/27/15 0608  BP: 122/70 121/80 118/63 120/65  Pulse: 85 98 89 84  Temp: 98.6 F (37 C) 98.7 F (37.1 C) 98.8 F (37.1 C) 98.4 F (36.9 C)  TempSrc: Oral Oral Oral Oral  Resp: Height:      Weight:      SpO2: 100% 100% 99% 98%    ABD soft Sensation intact distally Intact pulses distally Dorsiflexion/Plantar flexion intact Incision: dressing C/D/I Compartment soft   Lab Results  Component Value Date   WBC 12.6* 04/27/2015   HGB 11.3* 04/27/2015   HCT 34.0* 04/27/2015   MCV 84.8 04/27/2015   PLT 192 04/27/2015   BMET    Component Value Date/Time   NA 138 04/27/2015 0442   K 4.2 04/27/2015 0442   CL 105 04/27/2015 0442   CO2 26 04/27/2015 0442   GLUCOSE 171* 04/27/2015 0442   BUN 10 04/27/2015 0442   CREATININE 1.01 04/27/2015 0442   CALCIUM 8.8* 04/27/2015 0442   GFRNONAA >60 04/27/2015 0442   GFRAA >60 04/27/2015 0442     Assessment/Plan: 1 Day Post-Op   Principal Problem:   Primary osteoarthritis of right hip   WBAT with walker DVT ppx: ASA, SCDs, TEDs PO pain control PT/OT Glucose control PT/OT Dispo: d/c home after clears therapy, today vs tomorrow   [place], Kinzler 04/27/2015, 6:54 AM   Samson Frederic, MD Cell [phone]

## 2015-04-27 NOTE — Progress Notes (Signed)
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  Reviewed stairs and car transfers.  Follow Up Recommendations  Home health PT     Equipment Recommendations  Rolling walker with 5" wheels    Recommendations for Other Services OT consult     Precautions / Restrictions Precautions Precautions: Fall Restrictions Weight Bearing Restrictions: No Other Position/Activity Restrictions: WBAT    Mobility  Bed Mobility Overal bed mobility: Needs Assistance Bed Mobility: Supine to Sit;Sit to Supine     Supine to sit: Min assist Sit to supine: Min assist   General bed mobility comments: cues for sequence and use of L LE to self assist  Transfers Overall transfer level: Needs assistance Equipment used: Rolling walker (2 wheeled) Transfers: Sit to/from Stand Sit to Stand: Supervision         General transfer comment: min cues for LE management and use of UEs to self assist  Ambulation/Gait Ambulation/Gait assistance: Min guard;Supervision Ambulation Distance (Feet): 200 Feet Assistive device: Rolling walker (2 wheeled) Gait Pattern/deviations: Step-to pattern;Step-through pattern;Decreased step length - right;Decreased step length - left;Shuffle;Trunk flexed Gait velocity: decr Gait velocity interpretation: Below normal speed for age/gender General Gait Details: cues for sequence, posture and position from RW   Stairs Stairs: Yes Stairs assistance: Min assist Stair Management: No rails;Step to pattern;Backwards;Forwards;With walker Number of Stairs: 4 General stair comments: Single step x 4 - twice fwd and twice bkwd  Wheelchair Mobility    Modified Rankin (Stroke Patients Only)       Balance                                     Cognition Arousal/Alertness: Awake/alert Behavior During Therapy: WFL for tasks assessed/performed Overall Cognitive Status: Within Functional Limits for tasks assessed                      Exercises Total Joint Exercises Ankle Circles/Pumps: AROM;Both;15 reps;Supine Quad Sets: AROM;Both;10 reps;Supine Heel Slides: AAROM;Right;20 reps;Supine Hip ABduction/ADduction: AAROM;Right;15 reps;Supine    General Comments        Pertinent Vitals/Pain Pain Assessment: 0-10 Pain Score: 3  Pain Location: L hip Pain Descriptors / Indicators: Aching;Sore Pain Intervention(s): Monitored during session;Limited activity within patient's tolerance;Premedicated before session    Home Living Family/patient expects to be discharged to:: Private residence Living Arrangements: Children Available Help at Discharge: Family;Available 24 hours/day Type of Home: House Home Access: Stairs to enter   Home Layout: One level Home Equipment: Bedside commode      Prior Function Level of Independence: Independent          PT Goals (current goals can now be found in the care plan section) Acute Rehab PT Goals Patient Stated Goal: home PT Goal Formulation: With patient Time For Goal Achievement: 04/29/15 Potential to Achieve Goals: Good Progress towards PT goals: Progressing toward goals    Frequency  7X/week    PT Plan Current plan remains appropriate    Co-evaluation             End of Session Equipment Utilized During Treatment: Gait belt Activity Tolerance: Patient tolerated treatment well Patient left: in chair;with call bell/phone within reach  Time: 1610-9604 PT Time Calculation (min) (ACUTE ONLY): 33 min  Charges:  $Gait Training: 8-22 mins $Therapeutic Exercise: 8-22 mins $Therapeutic Activity: 8-22 mins                    G Codes:      [name] 08-May-2015, 2:23 PM

## 2016-07-05 IMAGING — RF DG HIP (WITH PELVIS) OPERATIVE*R*
1 series · 1 of 1 positions shown · non-contrast
Comparison: Pelvis 02/06/2011

CLINICAL DATA: Right hip replacement

EXAM:
OPERATIVE right HIP (WITH PELVIS IF PERFORMED) 1 VIEW
TECHNIQUE: Fluoroscopic spot image(s) were submitted for interpretation
post-operatively.

[Series 1: run · 1 of 1 slices shown]
[im 1/1]
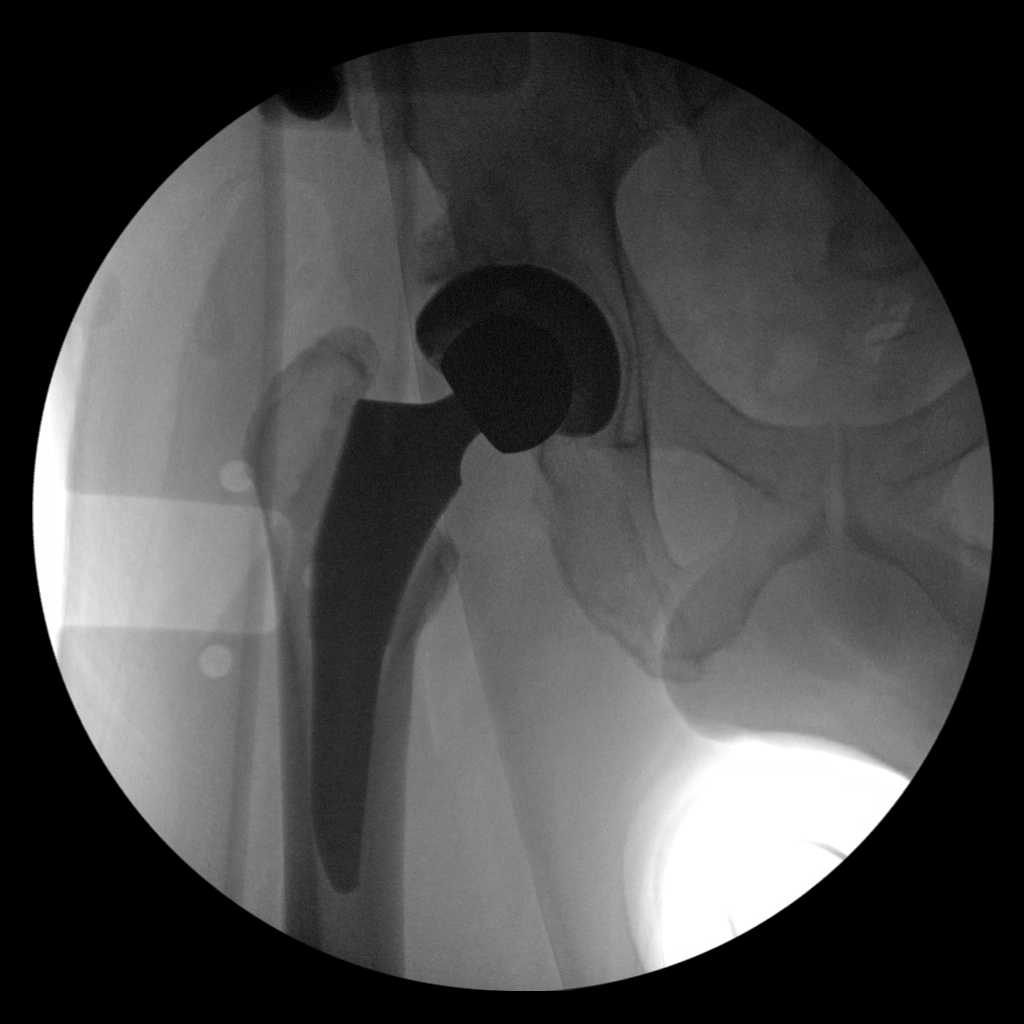

[1 of 1 positions shown; findings below may reference images not displayed]

FINDINGS: Intraoperative fluoroscopy is utilized for surgical control
purposes. Fluoroscopy time is recorded at 32 seconds. Exposure
recorded at 5.15 mGy. A single spot fluoroscopic images obtained.

Intraoperative spot fluoroscopic image demonstrates placement of a
right total hip arthroplasty using non cemented components.
Components appear well seated. Somewhat shallow appearance of the
acetabular angle. No acute fracture or dislocation. No radiopaque
soft tissue foreign bodies.
IMPRESSION: Intraoperative fluoroscopy is obtained for surgical control purposes
demonstrating placement of right total hip arthroplasty.
